# Patient Record
Sex: Female | Born: 1990 | Race: Black or African American | Hispanic: No | Marital: Single | Smoking: Current every day smoker
Health system: Southern US, Community
[De-identification: ages and names within clinical notes are randomized; demographics above are authoritative.]

## PROBLEM LIST (undated history)

## (undated) DIAGNOSIS — O039 Complete or unspecified spontaneous abortion without complication: Secondary | ICD-10-CM

---

## 2015-03-28 ENCOUNTER — Encounter (HOSPITAL_BASED_OUTPATIENT_CLINIC_OR_DEPARTMENT_OTHER): Payer: Self-pay | Admitting: *Deleted

## 2015-03-28 ENCOUNTER — Emergency Department (HOSPITAL_BASED_OUTPATIENT_CLINIC_OR_DEPARTMENT_OTHER)
Admission: EM | Admit: 2015-03-28 | Discharge: 2015-03-28 | Disposition: A | Discharge status: Home / Self Care | Payer: Medicaid Other | Attending: Emergency Medicine | Admitting: Emergency Medicine

## 2015-03-28 ENCOUNTER — Emergency Department (HOSPITAL_BASED_OUTPATIENT_CLINIC_OR_DEPARTMENT_OTHER): Payer: Medicaid Other

## 2015-03-28 DIAGNOSIS — O210 Mild hyperemesis gravidarum: Secondary | ICD-10-CM | POA: Insufficient documentation

## 2015-03-28 DIAGNOSIS — R1013 Epigastric pain: Secondary | ICD-10-CM | POA: Insufficient documentation

## 2015-03-28 DIAGNOSIS — R42 Dizziness and giddiness: Secondary | ICD-10-CM | POA: Insufficient documentation

## 2015-03-28 DIAGNOSIS — Z3A01 Less than 8 weeks gestation of pregnancy: Secondary | ICD-10-CM | POA: Insufficient documentation

## 2015-03-28 DIAGNOSIS — R110 Nausea: Secondary | ICD-10-CM

## 2015-03-28 DIAGNOSIS — O9989 Other specified diseases and conditions complicating pregnancy, childbirth and the puerperium: Secondary | ICD-10-CM | POA: Insufficient documentation

## 2015-03-28 DIAGNOSIS — O26899 Other specified pregnancy related conditions, unspecified trimester: Secondary | ICD-10-CM

## 2015-03-28 DIAGNOSIS — Z3491 Encounter for supervision of normal pregnancy, unspecified, first trimester: Secondary | ICD-10-CM

## 2015-03-28 DIAGNOSIS — O99331 Smoking (tobacco) complicating pregnancy, first trimester: Secondary | ICD-10-CM | POA: Insufficient documentation

## 2015-03-28 DIAGNOSIS — F17210 Nicotine dependence, cigarettes, uncomplicated: Secondary | ICD-10-CM | POA: Insufficient documentation

## 2015-03-28 DIAGNOSIS — R109 Unspecified abdominal pain: Secondary | ICD-10-CM

## 2015-03-28 LAB — COMPREHENSIVE METABOLIC PANEL
ALK PHOS: 35 U/L — AB (ref 38–126)
ALT: 10 U/L — AB (ref 14–54)
AST: 14 U/L — AB (ref 15–41)
Albumin: 3.6 g/dL (ref 3.5–5.0)
Anion gap: 5 (ref 5–15)
BUN: 15 mg/dL (ref 6–20)
CALCIUM: 10.3 mg/dL (ref 8.9–10.3)
CHLORIDE: 108 mmol/L (ref 101–111)
CO2: 22 mmol/L (ref 22–32)
CREATININE: 0.63 mg/dL (ref 0.44–1.00)
GFR calc Af Amer: >60 mL/min (ref >60)
Glucose, Bld: 86 mg/dL (ref 65–99)
Potassium: 4.1 mmol/L (ref 3.5–5.1)
Sodium: 135 mmol/L (ref 135–145)
Total Bilirubin: 0.3 mg/dL (ref 0.3–1.2)
Total Protein: 6.7 g/dL (ref 6.5–8.1)

## 2015-03-28 LAB — CBC WITH DIFFERENTIAL/PLATELET
BASOS PCT: 0 %
Basophils Absolute: 0 10*3/uL (ref 0.0–0.1)
EOS PCT: 4 %
Eosinophils Absolute: 0.3 10*3/uL (ref 0.0–0.7)
HCT: 36.7 % (ref 36.0–46.0)
Hemoglobin: 12.2 g/dL (ref 12.0–15.0)
LYMPHS ABS: 2.2 10*3/uL (ref 0.7–4.0)
Lymphocytes Relative: 32 %
MCH: 30 pg (ref 26.0–34.0)
MCHC: 33.2 g/dL (ref 30.0–36.0)
MCV: 90.4 fL (ref 78.0–100.0)
MONOS PCT: 9 %
Monocytes Absolute: 0.6 10*3/uL (ref 0.1–1.0)
Neutro Abs: 3.8 10*3/uL (ref 1.7–7.7)
Neutrophils Relative %: 55 %
PLATELETS: 207 10*3/uL (ref 150–400)
RBC: 4.06 MIL/uL (ref 3.87–5.11)
RDW: 13.4 % (ref 11.5–15.5)
WBC: 6.9 10*3/uL (ref 4.0–10.5)

## 2015-03-28 LAB — URINALYSIS, ROUTINE W REFLEX MICROSCOPIC
Bilirubin Urine: NEGATIVE
GLUCOSE, UA: NEGATIVE mg/dL
HGB URINE DIPSTICK: NEGATIVE
Ketones, ur: NEGATIVE mg/dL
Leukocytes, UA: NEGATIVE
Nitrite: NEGATIVE
PH: 7 (ref 5.0–8.0)
Protein, ur: NEGATIVE mg/dL
SPECIFIC GRAVITY, URINE: 1.022 (ref 1.005–1.030)

## 2015-03-28 LAB — LIPASE, BLOOD: Lipase: 28 U/L (ref 11–51)

## 2015-03-28 LAB — ABO/RH: ABO/RH(D): A POS

## 2015-03-28 LAB — PREGNANCY, URINE: PREG TEST UR: POSITIVE — AB

## 2015-03-28 LAB — HCG, QUANTITATIVE, PREGNANCY: HCG, BETA CHAIN, QUANT, S: 98263 m[IU]/mL — AB (ref <5)

## 2015-03-28 NOTE — Discharge Instructions (Signed)
Follow-up with an obstetrician in the next 1-2 weeks.  [facility] [place] is been provided in this discharge summary for you to call and arrange this appointment.   First Trimester of Pregnancy The first trimester of pregnancy is from week 1 until the end of week 12 (months 1 through 3). A week after a sperm fertilizes an egg, the egg will implant on the wall of the uterus. This embryo will begin to develop into a baby. Genes from you and your partner are forming the baby. The female genes determine whether the baby is a boy or a girl. At 6-8 weeks, the eyes and face are formed, and the heartbeat can be seen on ultrasound. At the end of 12 weeks, all the baby's organs are formed.  Now that you are pregnant, you will want to do everything you can to have a healthy baby. Two of the most important things are to get good prenatal care and to follow your health care provider's instructions. Prenatal care is all the medical care you receive before the baby's birth. This care will help prevent, find, and treat any problems during the pregnancy and childbirth. BODY CHANGES Your body goes through many changes during pregnancy. The changes vary from woman to woman.   You may gain or lose a couple of pounds at first.  You may feel sick to your stomach (nauseous) and throw up (vomit). If the vomiting is uncontrollable, call your health care provider.  You may tire easily.  You may develop headaches that can be relieved by medicines approved by your health care provider.  You may urinate more often. Painful urination may mean you have a bladder infection.  You may develop heartburn as a result of your pregnancy.  You may develop constipation because certain hormones are causing the muscles that push waste through your intestines to slow down.  You may develop hemorrhoids or swollen, bulging veins (varicose veins).  Your breasts may begin to grow larger and become  tender. Your nipples may stick out more, and the tissue that surrounds them (areola) may become darker.  Your gums may bleed and may be sensitive to brushing and flossing.  Dark spots or blotches (chloasma, mask of pregnancy) may develop on your face. This will likely fade after the baby is born.  Your menstrual periods will stop.  You may have a loss of appetite.  You may develop cravings for certain kinds of food.  You may have changes in your emotions from day to day, such as being excited to be pregnant or being concerned that something may go wrong with the pregnancy and baby.  You may have more vivid and strange dreams.  You may have changes in your hair. These can include thickening of your hair, rapid growth, and changes in texture. Some women also have hair loss during or after pregnancy, or hair that feels dry or thin. Your hair will most likely return to normal after your baby is born. WHAT TO EXPECT AT YOUR PRENATAL VISITS During a routine prenatal visit:  You will be weighed to make sure you and the baby are growing normally.  Your blood pressure will be taken.  Your abdomen will be measured to track your baby's growth.  The fetal heartbeat will be listened to starting around week 10 or 12 of your pregnancy.  Test results from any previous visits will be discussed. Your health care provider may ask you:  How you are  feeling.  If you are feeling the baby move.  If you have had any abnormal symptoms, such as leaking fluid, bleeding, severe headaches, or abdominal cramping.  If you are using any tobacco products, including cigarettes, chewing tobacco, and electronic cigarettes.  If you have any questions. Other tests that may be performed during your first trimester include:  Blood tests to find your blood type and to check for the presence of any previous infections. They will also be used to check for low iron levels (anemia) and Rh antibodies. Later in the  pregnancy, blood tests for diabetes will be done along with other tests if problems develop.  Urine tests to check for infections, diabetes, or protein in the urine.  An ultrasound to confirm the proper growth and development of the baby.  An amniocentesis to check for possible genetic problems.  Fetal screens for spina bifida and Down syndrome.  You may need other tests to make sure you and the baby are doing well.  HIV (human immunodeficiency virus) testing. Routine prenatal testing includes screening for HIV, unless you choose not to have this test. HOME CARE INSTRUCTIONS  Medicines  Follow your health care provider's instructions regarding medicine use. Specific medicines may be either safe or unsafe to take during pregnancy.  Take your prenatal vitamins as directed.  If you develop constipation, try taking a stool softener if your health care provider approves. Diet  Eat regular, well-balanced meals. Choose a variety of foods, such as meat or vegetable-based protein, fish, milk and low-fat dairy products, vegetables, fruits, and whole grain breads and cereals. Your health care provider will help you determine the amount of weight gain that is right for you.  Avoid raw meat and uncooked cheese. These carry germs that can cause birth defects in the baby.  Eating four or five small meals rather than three large meals a day may help relieve nausea and vomiting. If you start to feel nauseous, eating a few soda crackers can be helpful. Drinking liquids between meals instead of during meals also seems to help nausea and vomiting.  If you develop constipation, eat more high-fiber foods, such as fresh vegetables or fruit and whole grains. Drink enough fluids to keep your urine clear or pale yellow. Activity and Exercise  Exercise only as directed by your health care provider. Exercising will help you:  Control your weight.  Stay in shape.  Be prepared for labor and  delivery.  Experiencing pain or cramping in the lower abdomen or low back is a good sign that you should stop exercising. Check with your health care provider before continuing normal exercises.  Try to avoid standing for long periods of time. Move your legs often if you must stand in one place for a long time.  Avoid heavy lifting.  Wear low-heeled shoes, and practice good posture.  You may continue to have sex unless your health care provider directs you otherwise. Relief of Pain or Discomfort  Wear a good support bra for breast tenderness.   Take warm sitz baths to soothe any pain or discomfort caused by hemorrhoids. Use hemorrhoid cream if your health care provider approves.   Rest with your legs elevated if you have leg cramps or low back pain.  If you develop varicose veins in your legs, wear support hose. Elevate your feet for 15 minutes, 3-4 times a day. Limit salt in your diet. Prenatal Care  Schedule your prenatal visits by the twelfth week of pregnancy. They are usually  scheduled monthly at first, then more often in the last 2 months before delivery.  Write down your questions. Take them to your prenatal visits.  Keep all your prenatal visits as directed by your health care provider. Safety  Wear your seat belt at all times when driving.  Make a list of emergency phone numbers, including numbers for family, friends, the hospital, and police and fire departments. General Tips  Ask your health care provider for a referral to a local prenatal education class. Begin classes no later than at the beginning of month 6 of your pregnancy.  Ask for help if you have counseling or nutritional needs during pregnancy. Your health care provider can offer advice or refer you to specialists for help with various needs.  Do not use hot tubs, steam rooms, or saunas.  Do not douche or use tampons or scented sanitary pads.  Do not cross your legs for long periods of time.  Avoid  cat litter boxes and soil used by cats. These carry germs that can cause birth defects in the baby and possibly loss of the fetus by miscarriage or stillbirth.  Avoid all smoking, herbs, alcohol, and medicines not prescribed by your health care provider. Chemicals in these affect the formation and growth of the baby.  Do not use any tobacco products, including cigarettes, chewing tobacco, and electronic cigarettes. If you need help quitting, ask your health care provider. You may receive counseling support and other resources to help you quit.  Schedule a dentist appointment. At home, brush your teeth with a soft toothbrush and be gentle when you floss. SEEK MEDICAL CARE IF:   You have dizziness.  You have mild pelvic cramps, pelvic pressure, or nagging pain in the abdominal area.  You have persistent nausea, vomiting, or diarrhea.  You have a bad smelling vaginal discharge.  You have pain with urination.  You notice increased swelling in your face, hands, legs, or ankles. SEEK IMMEDIATE MEDICAL CARE IF:   You have a fever.  You are leaking fluid from your vagina.  You have spotting or bleeding from your vagina.  You have severe abdominal cramping or pain.  You have rapid weight gain or loss.  You vomit blood or material that looks like coffee grounds.  You are exposed to [place] measles and have never had them.  You are exposed to fifth disease or chickenpox.  You develop a severe headache.  You have shortness of breath.  You have any kind of trauma, such as from a fall or a car accident.   This information is not intended to replace advice given to you by your health care provider. Make sure you discuss any questions you have with your health care provider.   Document Released: 04/08/2001 Document Revised: 05/05/2014 Document Reviewed: 02/22/2013 Elsevier Interactive Patient Education [employer]

## 2015-03-28 NOTE — ED Provider Notes (Signed)
CSN: [id number]     Arrival date & time 03/28/15  1756 History  By signing my name below, I, Soijett Blue, attest that this documentation has been prepared under the direction and in the presence of Geoffery Lyons, MD. Electronically Signed: Soijett Blue, ED Scribe. 03/28/2015. 6:48 PM.   Chief Complaint  Patient presents with  . Nausea  . Dizziness      Patient is a 24 y.o. female presenting with abdominal pain. The history is provided by the patient. No language interpreter was used.  Abdominal Pain Pain location:  Epigastric Pain radiates to:  Does not radiate Pain severity:  Mild Onset quality:  Gradual Duration:  2 weeks Timing:  Intermittent Progression:  Unchanged Chronicity:  New Context: not sick contacts   Relieved by:  None tried Worsened by:  Nothing tried Ineffective treatments:  None tried Associated symptoms: nausea   Associated symptoms: no constipation, no diarrhea, no dysuria and no hematuria     HPI Comments: Carol Tate is a 24 y.o. female who presents to the Emergency Department complaining of intermittent nausea onset 2-3 weeks. She notes that yesterday when she ate she vomited following and that is not normal for her. She states that her LMP was in October and that her periods have always been irregular unless she was on birth control. She reports that he stopped taking her birth control earlier this year due to side effects of the medication and she never started taking birth control again. She states that she is having associated symptoms of dizziness, upper abdominal pain, and mid back pain. She denies constipation, diarrhea, dysuria, hematuria, and any other symptoms. Denies PMHx of acid reflux at this time.    History reviewed. No pertinent past medical history. History reviewed. No pertinent past surgical history. No family history on file. Social History  Substance Use Topics  . Smoking status: Current Every Day Smoker    Types: Cigarettes  .  Smokeless tobacco: Never Used  . Alcohol Use: Yes     Comment: 4 beers/week   OB History    No data available     Review of Systems  Gastrointestinal: Positive for nausea and abdominal pain. Negative for diarrhea and constipation.  Genitourinary: Negative for dysuria and hematuria.  All other systems reviewed and are negative.    Allergies  Review of patient's allergies indicates no known allergies.  Home Medications   Prior to Admission medications   Not on File   BP 125/80 mmHg  Pulse 64  Temp(Src) 98.4 F (36.9 C) (Oral)  Resp 16  Ht  (1.626 m)  Wt 192 lb (87.091 kg)  BMI 32.94 kg/m2  SpO2 100%  LMP 03/28/2015 Physical Exam  Constitutional: She is oriented to person, place, and time. She appears well-developed and well-nourished. No distress.  HENT:  Head: Normocephalic and atraumatic.  Mouth/Throat: Oropharynx is clear and moist.  Eyes: EOM are normal.  Neck: Neck supple.  Cardiovascular: Normal rate, regular rhythm and normal heart sounds.  Exam reveals no gallop and no friction rub.   No murmur heard. Pulmonary/Chest: Effort normal and breath sounds normal. No respiratory distress. She has no wheezes. She has no rales.  Abdominal: Soft. There is tenderness in the epigastric area. There is no rebound, no guarding and no CVA tenderness.  TTP in the epigastric region.   Musculoskeletal: Normal range of motion.  Neurological: She is alert and oriented to person, place, and time.  Skin: Skin is warm and dry. She is  not diaphoretic.  Psychiatric: She has a normal mood and affect. Her behavior is normal.  Nursing note and vitals reviewed.   ED Course  Procedures (including critical care time) DIAGNOSTIC STUDIES: Oxygen Saturation is 100% on RA, nl by my interpretation.    COORDINATION OF CARE: 6:47 PM Discussed treatment plan with pt at bedside which includes UA, labs, and US OB and pt agreed to plan.    Labs Review Labs Reviewed  URINALYSIS,  ROUTINE W REFLEX MICROSCOPIC (NOT AT [facility]) - Abnormal; Notable for the following:    APPearance CLOUDY (*)    All other components within normal limits  PREGNANCY, URINE - Abnormal; Notable for the following:    Preg Test, Ur POSITIVE (*)    All other components within normal limits  COMPREHENSIVE METABOLIC PANEL - Abnormal; Notable for the following:    AST 14 (*)    ALT 10 (*)    Alkaline Phosphatase 35 (*)    All other components within normal limits  HCG, QUANTITATIVE, PREGNANCY - Abnormal; Notable for the following:    hCG, Beta Chain, Quant, [place] [id number] (*)    All other components within normal limits  CBC WITH DIFFERENTIAL/PLATELET  LIPASE, BLOOD  ABO/RH    Imaging Review [place] Ob Comp Less 14 Wks  03/28/2015  CLINICAL DATA:  Increased abdominal pain and nausea EXAM: OBSTETRIC <14 WK US AND TRANSVAGINAL OB US TECHNIQUE: Both transabdominal and transvaginal ultrasound examinations were performed for complete evaluation of the gestation as well as the maternal uterus, adnexal regions, and pelvic cul-de-sac. Transvaginal technique was performed to assess early pregnancy. COMPARISON:  None. FINDINGS: Intrauterine gestational sac: Visualized/normal in shape. Yolk sac:  Visualized Embryo:  Visualized Cardiac Activity: Detected Heart Rate: 133  bpm MSD: 2.4 cm  mm   7 w   3  d US EDC: 11/11/2015 Maternal uterus/adnexae: Single intrauterine gestational sac. Cardiac activity detected. There appears to be to small adjacent fetal poles demonstrating cardiac activity however these are too small to confirm twin gestation. No subchorionic hemorrhage or acute finding. Ovaries appear normal. No free fluid. IMPRESSION: Viable intrauterine pregnancy. Query twin gestation, too small to confirm at this point. No acute or complicating feature by ultrasound. Electronically Signed   By: Judie PetitM.  Shick M.D.   On: 03/28/2015 20:47   [place] Ob Transvaginal  03/28/2015  CLINICAL DATA:  Increased abdominal pain and nausea  EXAM: OBSTETRIC <14 WK US AND TRANSVAGINAL OB US TECHNIQUE: Both transabdominal and transvaginal ultrasound examinations were performed for complete evaluation of the gestation as well as the maternal uterus, adnexal regions, and pelvic cul-de-sac. Transvaginal technique was performed to assess early pregnancy. COMPARISON:  None. FINDINGS: Intrauterine gestational sac: Visualized/normal in shape. Yolk sac:  Visualized Embryo:  Visualized Cardiac Activity: Detected Heart Rate: 133  bpm MSD: 2.4 cm  mm   7 w   3  d US EDC: 11/11/2015 Maternal uterus/adnexae: Single intrauterine gestational sac. Cardiac activity detected. There appears to be to small adjacent fetal poles demonstrating cardiac activity however these are too small to confirm twin gestation. No subchorionic hemorrhage or acute finding. Ovaries appear normal. No free fluid. IMPRESSION: Viable intrauterine pregnancy. Query twin gestation, too small to confirm at this point. No acute or complicating feature by ultrasound. Electronically Signed   By: Judie PetitM.  Shick M.D.   On: 03/28/2015 20:47   I have personally reviewed and evaluated these images and lab results as part of my medical decision-making.   EKG Interpretation None  MDM   Final diagnoses:  None    Patient presents with nausea and dizziness over the past several weeks. Her complaints are nonspecific and physical examination is unremarkable. Her workup does reveal a positive urine pregnancy test which came as quite a surprise to her. She underwent an ultrasound which revealed and intrauterine pregnancy and the possibility of a twin gestation. There are no other acute or complicating features by ultrasound and her laboratory studies are all reassuring. She will be discharged home with OB follow-up.  I personally performed the services described in this documentation, which was scribed in my presence. The recorded information has been reviewed and is accurate.       Geoffery Lyons,  MD 03/28/15 [phone]

## 2015-03-28 NOTE — ED Notes (Signed)
2-3 weeks of intermittent nausea and feeling lightheaded "like I'm going to pass out"

## 2017-04-04 IMAGING — US US OB COMP LESS 14 WK
1 series · 14 of 28 positions shown · non-contrast
Comparison: None.

CLINICAL DATA: Increased abdominal pain and nausea

EXAM:
OBSTETRIC <14 WK US AND TRANSVAGINAL OB US
TECHNIQUE: Both transabdominal and transvaginal ultrasound examinations were
performed for complete evaluation of the gestation as well as the
maternal uterus, adnexal regions, and pelvic cul-de-sac.
Transvaginal technique was performed to assess early pregnancy.

[Series 1: us ob comp less 14 wk · 0.10mm/px · 29 acquisitions, 14 frames shown]
[im 2/29]
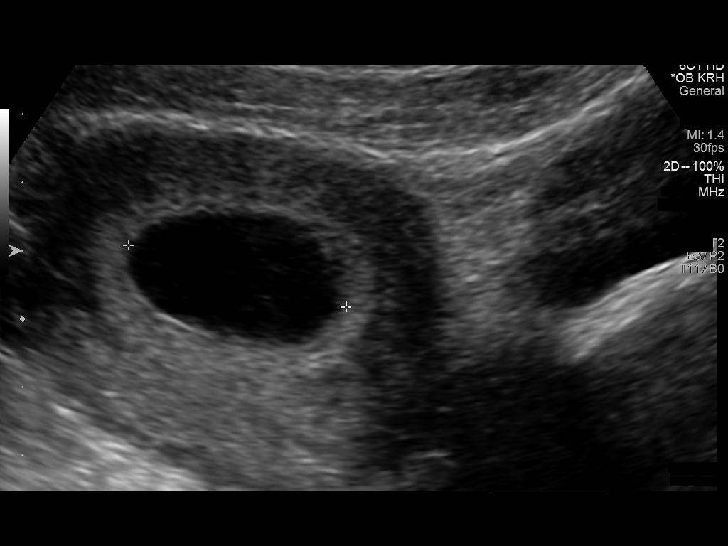
[im 4/29]
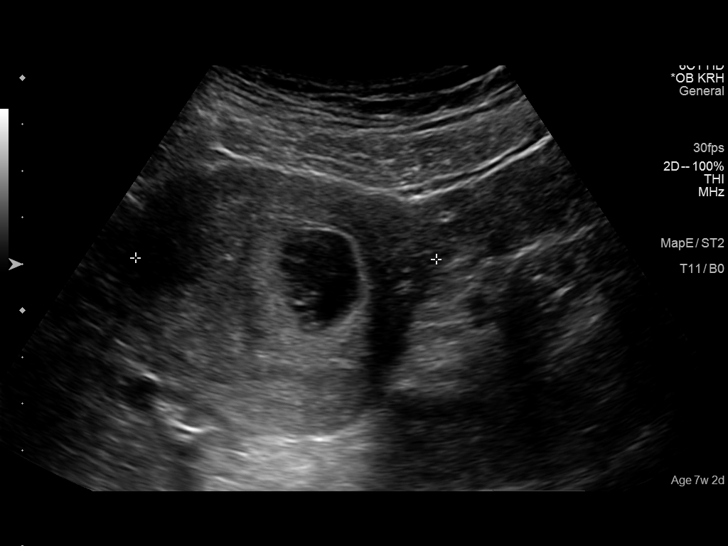
[im 6/29]
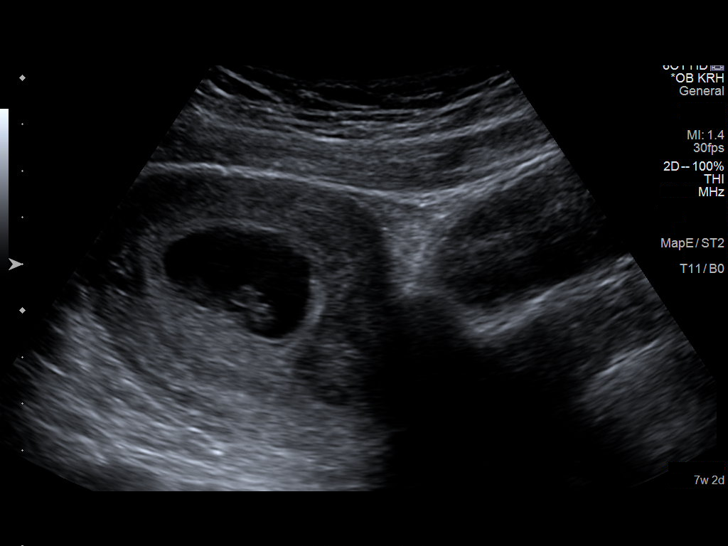
[im 8/29]
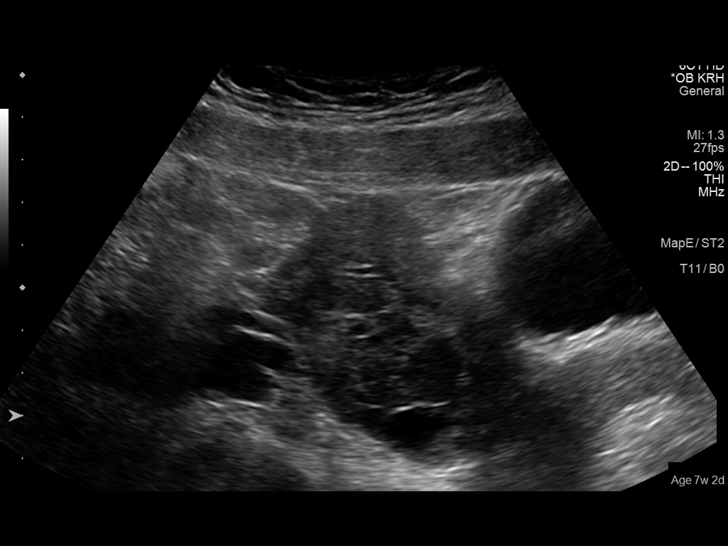
[im 10/29]
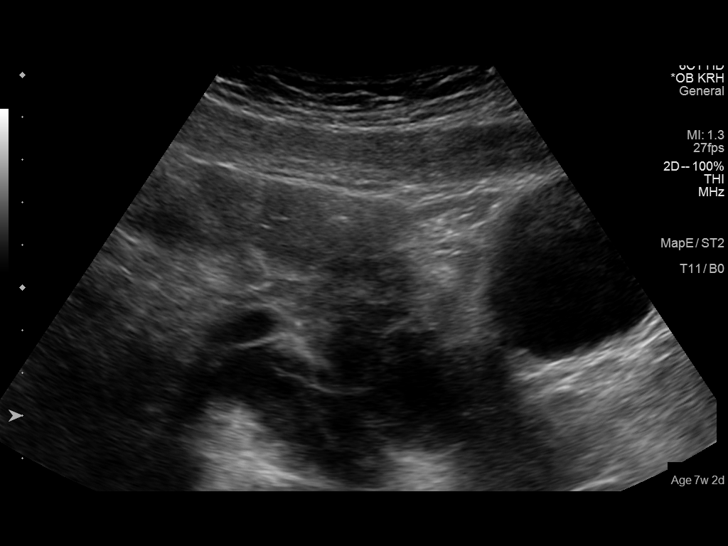
[im 12/29]
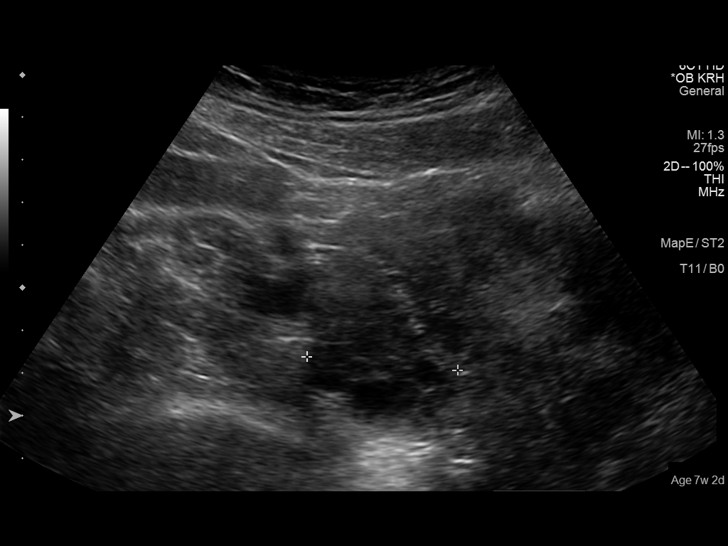
[im 14/29]
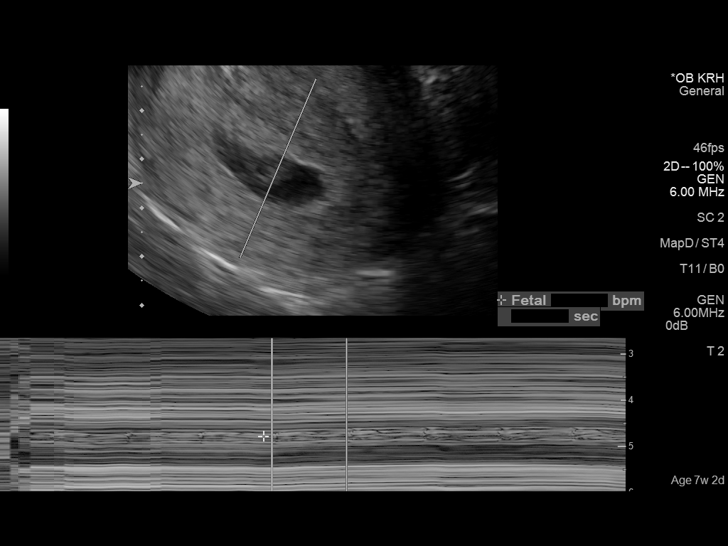
[im 16/29]
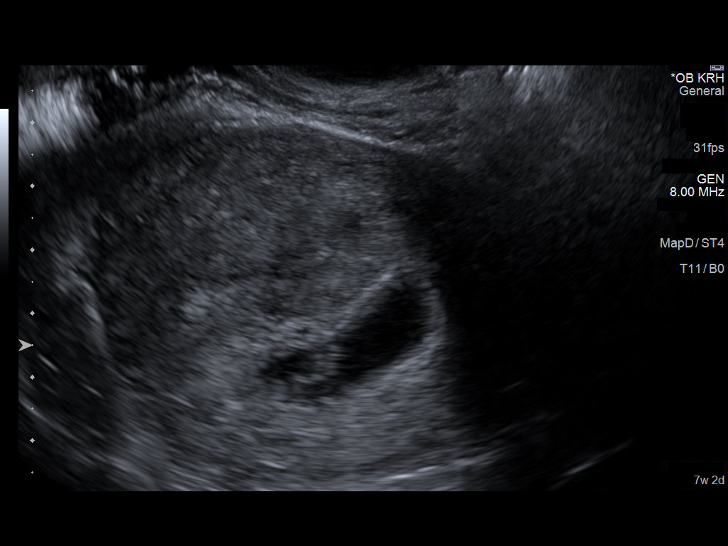
[im 18/29]
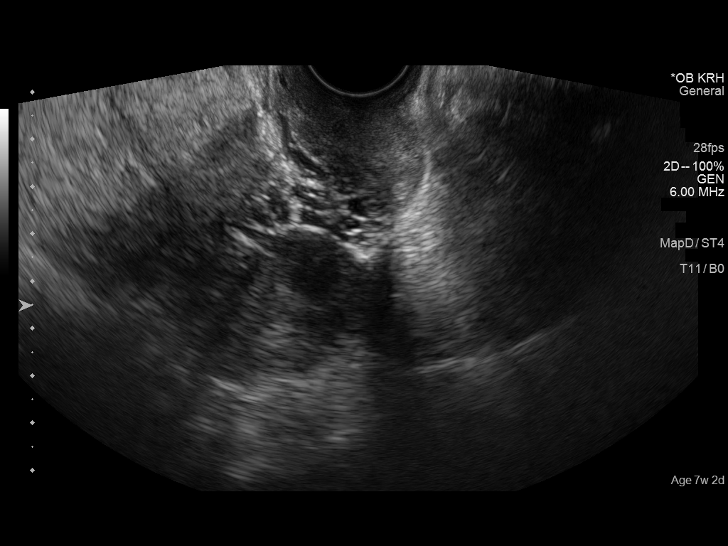
[im 20/29]
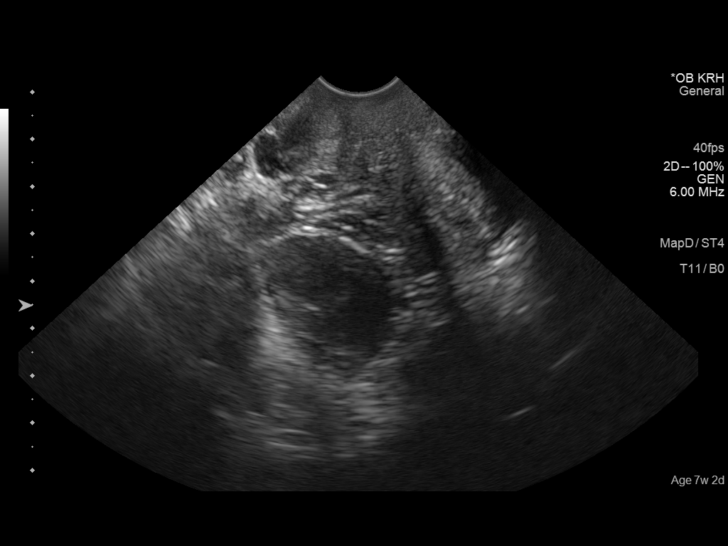
[im 22/29]
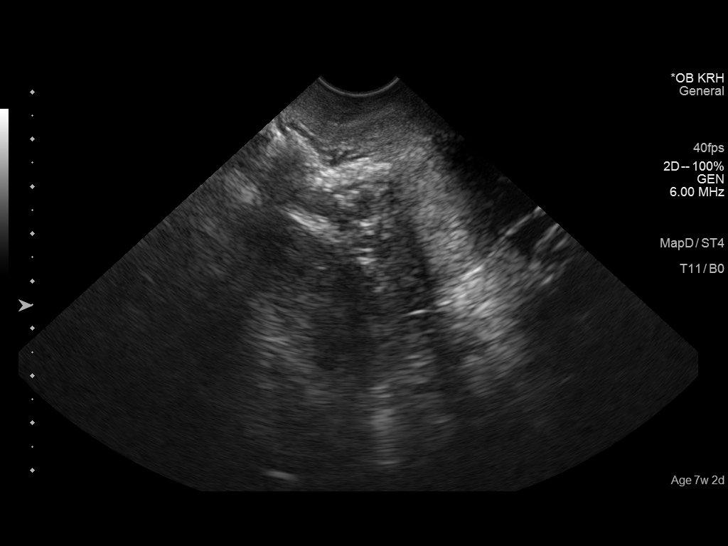
[im 24/29]
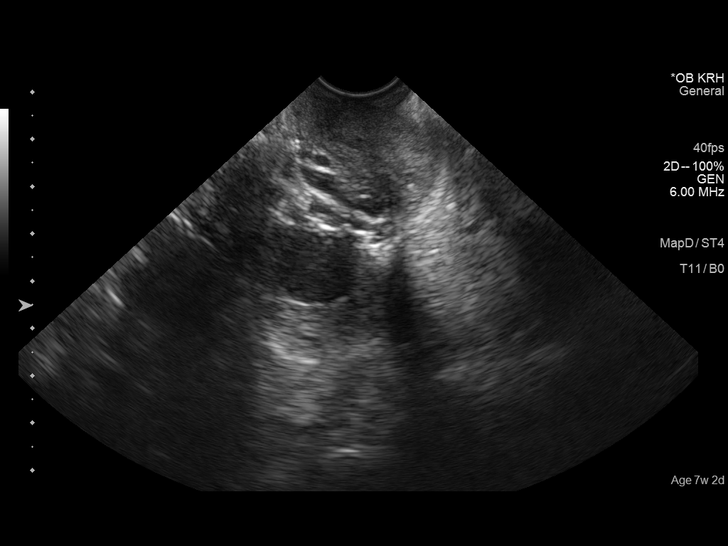
[im 26/29]
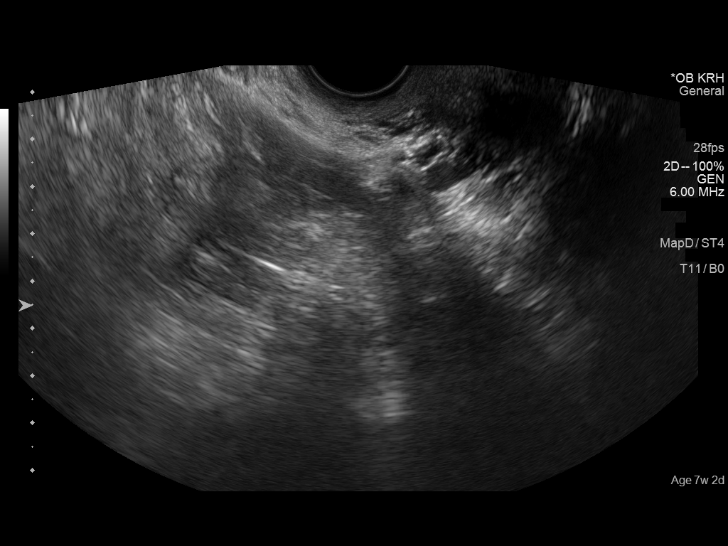
[im 29/29]
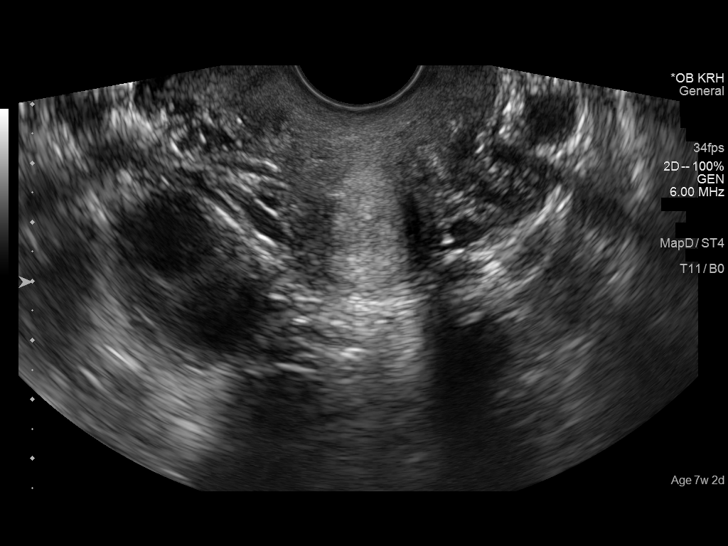

[14 of 28 positions shown; findings below may reference images not displayed]

FINDINGS: Intrauterine gestational sac: Visualized/normal in shape.

Yolk sac:  Visualized

Embryo:  Visualized

Cardiac Activity: Detected

Heart Rate: 133  bpm

MSD: 2.4 cm  mm   7 w   3  d

US EDC: 11/11/2015

Maternal uterus/adnexae: Single intrauterine gestational sac.
Cardiac activity detected. There appears to be to small adjacent
fetal poles demonstrating cardiac activity however these are too
small to confirm twin gestation. No subchorionic hemorrhage or acute
finding. Ovaries appear normal. No free fluid.
IMPRESSION: Viable intrauterine pregnancy. Query twin gestation, too small to
confirm at this point. No acute or complicating feature by
ultrasound.

## 2019-02-17 ENCOUNTER — Other Ambulatory Visit: Payer: Self-pay

## 2019-02-17 ENCOUNTER — Emergency Department (HOSPITAL_BASED_OUTPATIENT_CLINIC_OR_DEPARTMENT_OTHER)
Admission: EM | Admit: 2019-02-17 | Discharge: 2019-02-17 | Disposition: A | Discharge status: Home / Self Care | Payer: Self-pay | Attending: Emergency Medicine | Admitting: Emergency Medicine

## 2019-02-17 ENCOUNTER — Encounter (HOSPITAL_BASED_OUTPATIENT_CLINIC_OR_DEPARTMENT_OTHER): Payer: Self-pay

## 2019-02-17 DIAGNOSIS — Y9389 Activity, other specified: Secondary | ICD-10-CM | POA: Insufficient documentation

## 2019-02-17 DIAGNOSIS — S169XXA Unspecified injury of muscle, fascia and tendon at neck level, initial encounter: Secondary | ICD-10-CM | POA: Insufficient documentation

## 2019-02-17 DIAGNOSIS — Z880 Allergy status to penicillin: Secondary | ICD-10-CM | POA: Insufficient documentation

## 2019-02-17 DIAGNOSIS — S161XXA Strain of muscle, fascia and tendon at neck level, initial encounter: Secondary | ICD-10-CM

## 2019-02-17 DIAGNOSIS — Y999 Unspecified external cause status: Secondary | ICD-10-CM | POA: Insufficient documentation

## 2019-02-17 DIAGNOSIS — Y92410 Unspecified street and highway as the place of occurrence of the external cause: Secondary | ICD-10-CM | POA: Insufficient documentation

## 2019-02-17 DIAGNOSIS — F17210 Nicotine dependence, cigarettes, uncomplicated: Secondary | ICD-10-CM | POA: Insufficient documentation

## 2019-02-17 MED ORDER — CYCLOBENZAPRINE HCL 10 MG PO TABS: 5.0000 mg | ORAL_TABLET | Freq: Two times a day (BID) | ORAL | 0 refills | Status: DC | PRN

## 2019-02-17 MED ORDER — MELOXICAM 15 MG PO TABS: 15.0000 mg | ORAL_TABLET | Freq: Every day | ORAL | 0 refills | Status: DC

## 2019-02-17 MED FILL — CYCLOBENZAPRINE HCL 10 MG T: 10 | 10 days supply | Qty: 20 | Fill #0

## 2019-02-17 MED FILL — MELOXICAM 15 MG TABLET: 15 | 10 days supply | Qty: 10 | Fill #0
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
## 2019-02-17 NOTE — Discharge Instructions (Addendum)

## 2019-03-07 ENCOUNTER — Ambulatory Visit (HOSPITAL_BASED_OUTPATIENT_CLINIC_OR_DEPARTMENT_OTHER): Admission: RE | Admit: 2019-03-07 | Payer: Self-pay | Source: Ambulatory Visit

## 2019-03-07 ENCOUNTER — Encounter (HOSPITAL_BASED_OUTPATIENT_CLINIC_OR_DEPARTMENT_OTHER): Payer: Self-pay

## 2019-03-07 ENCOUNTER — Other Ambulatory Visit: Payer: Self-pay

## 2019-03-07 ENCOUNTER — Emergency Department (HOSPITAL_BASED_OUTPATIENT_CLINIC_OR_DEPARTMENT_OTHER)
Admission: EM | Admit: 2019-03-07 | Discharge: 2019-03-07 | Discharge status: Against Medical Advice | Payer: Self-pay | Attending: Emergency Medicine | Admitting: Emergency Medicine

## 2019-03-07 DIAGNOSIS — F17210 Nicotine dependence, cigarettes, uncomplicated: Secondary | ICD-10-CM | POA: Insufficient documentation

## 2019-03-07 DIAGNOSIS — Z79899 Other long term (current) drug therapy: Secondary | ICD-10-CM | POA: Insufficient documentation

## 2019-03-07 DIAGNOSIS — Z5329 Procedure and treatment not carried out because of patient's decision for other reasons: Secondary | ICD-10-CM | POA: Insufficient documentation

## 2019-03-07 DIAGNOSIS — O200 Threatened abortion: Secondary | ICD-10-CM | POA: Insufficient documentation

## 2019-03-07 LAB — BASIC METABOLIC PANEL
Anion gap: 7 (ref 5–15)
BUN: 11 mg/dL (ref 6–20)
CO2: 24 mmol/L (ref 22–32)
Calcium: 10.2 mg/dL (ref 8.9–10.3)
Chloride: 108 mmol/L (ref 98–111)
Creatinine, Ser: 0.56 mg/dL (ref 0.44–1.00)
GFR calc Af Amer: >60 mL/min (ref >60)
GFR calc non Af Amer: >60 mL/min (ref >60)
Glucose, Bld: 89 mg/dL (ref 70–99)
Potassium: 3.9 mmol/L (ref 3.5–5.1)
Sodium: 139 mmol/L (ref 135–145)

## 2019-03-07 LAB — CBC WITH DIFFERENTIAL/PLATELET
Abs Immature Granulocytes: 0.01 10*3/uL (ref 0.00–0.07)
Basophils Absolute: 0 10*3/uL (ref 0.0–0.1)
Basophils Relative: 1 %
Eosinophils Absolute: 0 10*3/uL (ref 0.0–0.5)
Eosinophils Relative: 1 %
HCT: 40.2 % (ref 36.0–46.0)
Hemoglobin: 12.6 g/dL (ref 12.0–15.0)
Immature Granulocytes: 0 %
Lymphocytes Relative: 26 %
Lymphs Abs: 1.5 10*3/uL (ref 0.7–4.0)
MCH: 28.8 pg (ref 26.0–34.0)
MCHC: 31.3 g/dL (ref 30.0–36.0)
MCV: 91.8 fL (ref 80.0–100.0)
Monocytes Absolute: 0.2 10*3/uL (ref 0.1–1.0)
Monocytes Relative: 4 %
Neutro Abs: 3.9 10*3/uL (ref 1.7–7.7)
Neutrophils Relative %: 68 %
Platelets: 322 10*3/uL (ref 150–400)
RBC: 4.38 MIL/uL (ref 3.87–5.11)
RDW: 15.2 % (ref 11.5–15.5)
WBC: 5.7 10*3/uL (ref 4.0–10.5)
nRBC: 0 % (ref 0.0–0.2)

## 2019-03-07 LAB — HCG, QUANTITATIVE, PREGNANCY: hCG, Beta Chain, Quant, S: 88 m[IU]/mL — ABNORMAL HIGH (ref <5)

## 2019-03-07 LAB — WET PREP, GENITAL
Sperm: NONE SEEN
Trich, Wet Prep: NONE SEEN
Yeast Wet Prep HPF POC: NONE SEEN

## 2019-03-07 LAB — PREGNANCY, URINE: Preg Test, Ur: NEGATIVE

## 2019-03-07 NOTE — ED Notes (Signed)
Pt had requested to leave without having the [place] that was ordered.  Pt states that she would like to go home.  Paperwork given, pt left ambulatory in no acute distress after signing AMA paperwork

## 2019-03-07 NOTE — ED Triage Notes (Signed)
Pt c/o vaginal bleeding x 1-1.5 weeks-sates LMP was in sept-states she had pos home preg test 2 weeks ago-NAD-steady gait

## 2019-03-07 NOTE — Discharge Instructions (Signed)
Please make sure you follow-up with the health department or another OB/GYN in 48 hours.  Return to the emergency department if you have any new or worsening symptoms.

## 2019-03-07 NOTE — ED Provider Notes (Signed)
[facility] EMERGENCY DEPARTMENT Provider Note   CSN: [id number] Arrival date & time: 03/07/19  1302     History   Chief Complaint Chief Complaint  Patient presents with  . Vaginal Bleeding    HPI Carol Tate is a 28 y.o. female.     Patient is a 28 year old female G4P2 presenting to the emergency department for pelvic cramping and vaginal bleeding.  Patient reports that she began a normal menstrual cycle mid-September.  Reports that her cycle then lasted longer than usual and she was having spotting with brown blood.  Reports that about a week ago she began to have more heavier brown and black blood and then large amounts of bright red blood with clots yesterday and today.  Reports that she is having some mild pelvic cramping.  Reports that she took a home pregnancy test 3 weeks ago and it was positive.  Has not seen OB/GYN.  Has a history of miscarriage.  Denies any fever, chills, nausea, vomiting.     History reviewed. No pertinent past medical history.  There are no active problems to display for this patient.   Past Surgical History:  Procedure Laterality Date  . CESAREAN SECTION       OB History   No obstetric history on file.      Home Medications    Prior to Admission medications   Medication Sig Start Date End Date Taking? Authorizing Provider  cyclobenzaprine (FLEXERIL) 10 MG tablet Take 0.5-1 tablets (5-10 mg total) by mouth 2 (two) times daily as needed for muscle spasms. 02/17/19   Arthor Captain, PA-C  meloxicam (MOBIC) 15 MG tablet Take 1 tablet (15 mg total) by mouth daily. Take 1 daily with food. 02/17/19   Arthor Captain, PA-C    Family History No family history on file.  Social History Social History   Tobacco Use  . Smoking status: Current Every Day Smoker    Types: Cigarettes  . Smokeless tobacco: Never Used  Substance Use Topics  . Alcohol use: Yes    Comment: occ  . Drug use: No     Allergies   Penicillins    Review of Systems Review of Systems  Constitutional: Negative for chills and fever.  Respiratory: Negative for cough and shortness of breath.   Gastrointestinal: Negative for abdominal pain, diarrhea, nausea and vomiting.  Genitourinary: Positive for pelvic pain and vaginal bleeding. Negative for decreased urine volume, dysuria, flank pain, frequency, vaginal discharge and vaginal pain.  Musculoskeletal: Negative for back pain.  Skin: Negative for rash and wound.  Neurological: Negative for dizziness, light-headedness and headaches.     Physical Exam Updated Vital Signs BP 121/81   Pulse 82   Temp 98.8 F (37.1 C) (Oral)   Resp 16   Ht 5\' 4"  (1.626 m)   Wt 68 kg   SpO2 100%   BMI 25.75 kg/m   Physical Exam Vitals signs and nursing note reviewed. Exam conducted with a chaperone present.  Constitutional:      General: She is not in acute distress.    Appearance: Normal appearance. She is not ill-appearing, toxic-appearing or diaphoretic.  HENT:     Head: Normocephalic.     Mouth/Throat:     Mouth: Mucous membranes are moist.  Eyes:     Conjunctiva/sclera: Conjunctivae normal.  Pulmonary:     Effort: Pulmonary effort is normal.  Abdominal:     General: Abdomen is flat.     Tenderness: There is no abdominal tenderness.  There is no guarding.  Genitourinary:    Labia:        Right: No rash or lesion.        Left: No rash or lesion.      Vagina: Bleeding present.     Cervix: No cervical motion tenderness.     Adnexa: Right adnexa normal and left adnexa normal.     Comments: Difficult to examine due to amount of blood in vagina.  Skin:    General: Skin is dry.  Neurological:     Mental Status: She is alert.  Psychiatric:        Mood and Affect: Mood normal.      ED Treatments / Results  Labs (all labs ordered are listed, but only abnormal results are displayed) Labs Reviewed  WET PREP, GENITAL - Abnormal; Notable for the following components:      Result Value    Clue Cells Wet Prep HPF POC PRESENT (*)    WBC, Wet Prep HPF POC FEW (*)    All other components within normal limits  HCG, QUANTITATIVE, PREGNANCY - Abnormal; Notable for the following components:   hCG, Beta Chain, Quant, S 88 (*)    All other components within normal limits  PREGNANCY, URINE  BASIC METABOLIC PANEL  CBC WITH DIFFERENTIAL/PLATELET  GC/CHLAMYDIA PROBE AMP ([place]) NOT AT [facility]    EKG None  Radiology No results found.  Procedures Procedures (including critical care time)  Medications Ordered in ED Medications - No data to display   Initial Impression / Assessment and Plan / ED Course  I have reviewed the triage vital signs and the nursing notes.  Pertinent labs & imaging results that were available during my care of the patient were reviewed by me and considered in my medical decision making (see chart for details).  Clinical Course as of Mar 07 1619  Mon Mar 07, 2019  1511 Patient here with vaginal bleeding and +home pregnancy test. Per report from the lab- the patient's urine pregnancy test turned positive just after the 3 minute time marker of the test. It was suggested to get an hcg quant although the urine pregnancy test will have to be documented as negative in the chart   [KM]  1612 Patient has a normal metabolic panel and normal CBC.  She does have a vaginal bleeding on my exam.  Her hCG was only 4488 which tells me that this patient is likely having a miscarriage.  This was discussed with the patient.  Offered ultrasound to look for any retained products.  However, patient reported that she would like to just follow-up with the health department.  Advised her that she should follow-up in 48 hours and if not she should return to the emergency department if she has new or worsening symptoms.  We discussed the nature and purpose, risks and benefits, as well as, the alternatives of treatment. Time was given to allow the opportunity to ask questions and  consider their options, and after the discussion, the patient decided to refuse the ultrasound stating she needed to leave to get her children. The patient was informed that refusal could lead to, but was not limited to, death, permanent disability, or severe pain. If present, I asked the relatives or significant others to dissuade them without success. Prior to refusing, I determined that the patient had the capacity to make their decision and understood the consequences of that decision. After refusal, I made every reasonable opportunity to treat them  to the best of my ability.  The patient was notified that they may return to the emergency department at any time for further treatment.       [KM]    Clinical Course User Index [KM] Alveria Apley, PA-C         Clinical Impression: 1. Threatened miscarriage        Final Clinical Impressions(s) / ED Diagnoses   Final diagnoses:  Threatened miscarriage    ED Discharge Orders    None       Kristine Royal 03/07/19 1620    Dorie Rank, MD 03/07/19 [phone]

## 2019-03-09 LAB — GC/CHLAMYDIA PROBE AMP (~~LOC~~) NOT AT ARMC
Chlamydia: NEGATIVE
Neisseria Gonorrhea: NEGATIVE

## 2019-05-13 ENCOUNTER — Encounter (HOSPITAL_BASED_OUTPATIENT_CLINIC_OR_DEPARTMENT_OTHER): Payer: Self-pay | Admitting: *Deleted

## 2019-05-13 ENCOUNTER — Other Ambulatory Visit: Payer: Self-pay

## 2019-05-13 ENCOUNTER — Emergency Department (HOSPITAL_BASED_OUTPATIENT_CLINIC_OR_DEPARTMENT_OTHER)
Admission: EM | Admit: 2019-05-13 | Discharge: 2019-05-13 | Disposition: A | Discharge status: Home / Self Care | Payer: Self-pay | Attending: Emergency Medicine | Admitting: Emergency Medicine

## 2019-05-13 DIAGNOSIS — Z113 Encounter for screening for infections with a predominantly sexual mode of transmission: Secondary | ICD-10-CM | POA: Insufficient documentation

## 2019-05-13 DIAGNOSIS — F17210 Nicotine dependence, cigarettes, uncomplicated: Secondary | ICD-10-CM | POA: Insufficient documentation

## 2019-05-13 DIAGNOSIS — B9689 Other specified bacterial agents as the cause of diseases classified elsewhere: Secondary | ICD-10-CM

## 2019-05-13 DIAGNOSIS — Z880 Allergy status to penicillin: Secondary | ICD-10-CM | POA: Insufficient documentation

## 2019-05-13 DIAGNOSIS — N760 Acute vaginitis: Secondary | ICD-10-CM | POA: Insufficient documentation

## 2019-05-13 HISTORY — DX: Complete or unspecified spontaneous abortion without complication: O03.9

## 2019-05-13 LAB — URINALYSIS, ROUTINE W REFLEX MICROSCOPIC
Bilirubin Urine: NEGATIVE
Glucose, UA: NEGATIVE mg/dL
Hgb urine dipstick: NEGATIVE
Ketones, ur: NEGATIVE mg/dL
Leukocytes,Ua: NEGATIVE
Nitrite: NEGATIVE
Protein, ur: NEGATIVE mg/dL
Specific Gravity, Urine: >1.03 — ABNORMAL HIGH (ref 1.005–1.030)
pH: 6.5 (ref 5.0–8.0)

## 2019-05-13 LAB — WET PREP, GENITAL
Sperm: NONE SEEN
Trich, Wet Prep: NONE SEEN
Yeast Wet Prep HPF POC: NONE SEEN

## 2019-05-13 LAB — PREGNANCY, URINE: Preg Test, Ur: NEGATIVE

## 2019-05-13 LAB — HIV ANTIBODY (ROUTINE TESTING W REFLEX): HIV Screen 4th Generation wRfx: NONREACTIVE

## 2019-05-13 MED ORDER — METRONIDAZOLE 500 MG PO TABS: 500.0000 mg | ORAL_TABLET | Freq: Two times a day (BID) | ORAL | 0 refills | Status: AC

## 2019-05-13 MED ORDER — METRONIDAZOLE 500 MG PO TABS: 500.0000 mg | ORAL_TABLET | Freq: Two times a day (BID) | ORAL | 0 refills | Status: DC

## 2019-05-13 MED FILL — METRONIDAZOLE 500 MG TABS: 500 | 7 days supply | Qty: 14 | Fill #0

## 2019-05-13 NOTE — Discharge Instructions (Addendum)
You were given a prescription for antibiotics. Please take the antibiotic prescription fully. Do not drink alcohol while taking this antibiotic.  You have been tested for HIV, syphilis, chlamydia and gonorrhea.  These results will be available in approximately 3 days and you will be contacted by the hospital if the results are positive. Avoid sexual contact until you are aware of the results, and please inform all sexual partners if you test positive for any of these diseases.  Please follow up with your primary care provider within 5-7 days for re-evaluation of your symptoms. If you do not have a primary care provider, information for a healthcare clinic has been provided for you to make arrangements for follow up care. Please return to the emergency department for any new or worsening symptoms.

## 2019-05-13 NOTE — ED Provider Notes (Signed)
[facility] EMERGENCY DEPARTMENT Provider Note   CSN: [id number] Arrival date & time: 05/13/19  3976     History Chief Complaint  Patient presents with  . Vaginal discharge    Carol Tate is a 29 y.o. female.  HPI   A 29 year old female presenting for evaluation of vaginal discharge.  States she had a miscarriage 02/2020.  She had a normal menstrual cycle in December.  Following her menstrual cycle she has had white discharge for about the last 2 weeks and she also reports a fishy odor.  Her symptoms feel similar to when she had BV in the past.  She states she has been with the same partner for the last 7 years and has lower concern for STIs however is requesting testing.  She denies fevers, abdominal pain, nausea, vomiting.  She does report some urinary frequency.  Denies dysuria.  Denies continued vaginal bleeding.  Past Medical History:  Diagnosis Date  . Miscarriage     There are no problems to display for this patient.   Past Surgical History:  Procedure Laterality Date  . CESAREAN SECTION       OB History    Gravida  4   Para  2   Term      Preterm      AB  2   Living        SAB      TAB      Ectopic      Multiple      Live Births              No family history on file.  Social History   Tobacco Use  . Smoking status: Current Every Day Smoker    Packs/day: 0.50    Types: Cigarettes  . Smokeless tobacco: Never Used  Substance Use Topics  . Alcohol use: Yes    Alcohol/week: 2.0 standard drinks    Types: 2 Cans of beer per week    Comment: at least 3-4 days a week  . Drug use: No    Home Medications Prior to Admission medications   Medication Sig Start Date End Date Taking? Authorizing Provider  metroNIDAZOLE (FLAGYL) 500 MG tablet Take 1 tablet (500 mg total) by mouth 2 (two) times daily for 7 days. 05/13/19 05/20/19  Isbella Arline S, PA-C    Allergies    Penicillins  Review of Systems   Review of Systems    Constitutional: Negative for chills and fever.  HENT: Negative for ear pain and sore throat.   Eyes: Negative for visual disturbance.  Respiratory: Negative for cough and shortness of breath.   Cardiovascular: Negative for chest pain.  Gastrointestinal: Negative for abdominal pain, constipation, diarrhea, nausea and vomiting.  Genitourinary: Positive for frequency and vaginal discharge. Negative for dysuria, flank pain, hematuria, pelvic pain and vaginal bleeding.  Musculoskeletal: Negative for back pain.  Skin: Negative for rash.  Neurological: Negative for headaches.  All other systems reviewed and are negative.   Physical Exam Updated Vital Signs BP 133/72 (BP Location: Right Arm)   Pulse 67   Temp 99.2 F (37.3 C) (Oral)   Resp 14   Ht 5\' 4"  (1.626 m)   Wt 68.9 kg   LMP 04/22/2019   SpO2 100%   BMI 26.09 kg/m   Physical Exam Vitals and nursing note reviewed.  Constitutional:      General: She is not in acute distress.    Appearance: She is well-developed.  HENT:  Head: Normocephalic and atraumatic.  Eyes:     Conjunctiva/sclera: Conjunctivae normal.  Cardiovascular:     Rate and Rhythm: Normal rate and regular rhythm.     Heart sounds: No murmur.  Pulmonary:     Effort: Pulmonary effort is normal. No respiratory distress.     Breath sounds: Normal breath sounds.  Abdominal:     General: Bowel sounds are normal.     Palpations: Abdomen is soft.     Tenderness: There is no abdominal tenderness. There is no guarding or rebound.  Genitourinary:    Comments: Exam performed by Rodney Booze,  exam chaperoned Date: 05/13/2019 Pelvic exam: normal external genitalia without evidence of trauma. VULVA: normal appearing vulva with no masses, tenderness or lesion. VAGINA: normal appearing vagina with normal color and no lesions. Moderate white discharge present.  CERVIX: normal appearing cervix without lesions, cervical motion tenderness absent, cervical os closed  with purulent white discharge present, Wet prep and DNA probe for chlamydia and GC obtained.   ADNEXA: normal adnexa in size, nontender and no masses UTERUS: uterus is normal size, shape, consistency and nontender.  Musculoskeletal:     Cervical back: Neck supple.  Skin:    General: Skin is warm and dry.  Neurological:     Mental Status: She is alert.     ED Results / Procedures / Treatments   Labs (all labs ordered are listed, but only abnormal results are displayed) Labs Reviewed  WET PREP, GENITAL - Abnormal; Notable for the following components:      Result Value   Clue Cells Wet Prep HPF POC PRESENT (*)    WBC, Wet Prep HPF POC MANY (*)    All other components within normal limits  URINALYSIS, ROUTINE W REFLEX MICROSCOPIC - Abnormal; Notable for the following components:   APPearance CLOUDY (*)    Specific Gravity, Urine >1.030 (*)    All other components within normal limits  PREGNANCY, URINE  HIV ANTIBODY (ROUTINE TESTING W REFLEX)  RPR  GC/CHLAMYDIA PROBE AMP ([place]) NOT AT [facility]    EKG None  Radiology No results found.  Procedures Procedures (including critical care time)  Medications Ordered in ED Medications - No data to display  ED Course  I have reviewed the triage vital signs and the nursing notes.  Pertinent labs & imaging results that were available during my care of the patient were reviewed by me and considered in my medical decision making (see chart for details).    MDM Rules/Calculators/A&P                      29 year old female presenting with 2-week history of white vaginal discharge.  No pelvic pain, vomiting, diarrhea.  Has urinary frequency but no other associated urinary symptoms.  Abdominal exam is benign.  Urinalysis shows no evidence of UTI.  Urine pregnancy test neg.  Wet prep with clue cells and wbc's, consistent with BV and will tx with flagyl.  GC chlamydia, HIV, RPR pending. States low risk for STD, will hold on empiric tx.  Advised f/u with pcp and return precautions. She voices understanding and is in agreement.   Final Clinical Impression(s) / ED Diagnoses Final diagnoses:  Bacterial vaginosis  Encounter for screening for bacterial sexually transmitted disease    Rx / DC Orders ED Discharge Orders         Ordered    metroNIDAZOLE (FLAGYL) 500 MG tablet  2 times daily  05/13/19 1116           Meryle Pugmire S, PA-C 05/13/19 1118    Raeford Razor, MD 05/13/19 1246

## 2019-05-13 NOTE — ED Triage Notes (Signed)
Had a  miscarriage 2 months ago and had some vaginal discharges and fishy odor for 2 and half weeks ago.

## 2019-05-14 LAB — RPR: RPR Ser Ql: NONREACTIVE

## 2019-05-16 LAB — GC/CHLAMYDIA PROBE AMP (~~LOC~~) NOT AT ARMC
Chlamydia: NEGATIVE
Neisseria Gonorrhea: NEGATIVE

## 2022-01-27 ENCOUNTER — Other Ambulatory Visit: Payer: Self-pay

## 2022-01-27 ENCOUNTER — Emergency Department (HOSPITAL_BASED_OUTPATIENT_CLINIC_OR_DEPARTMENT_OTHER)
Admission: EM | Admit: 2022-01-27 | Discharge: 2022-01-27 | Disposition: A | Discharge status: Home / Self Care | Payer: Self-pay | Attending: Emergency Medicine | Admitting: Emergency Medicine

## 2022-01-27 ENCOUNTER — Emergency Department (HOSPITAL_BASED_OUTPATIENT_CLINIC_OR_DEPARTMENT_OTHER): Payer: Self-pay

## 2022-01-27 ENCOUNTER — Encounter (HOSPITAL_BASED_OUTPATIENT_CLINIC_OR_DEPARTMENT_OTHER): Payer: Self-pay

## 2022-01-27 DIAGNOSIS — R000 Tachycardia, unspecified: Secondary | ICD-10-CM | POA: Insufficient documentation

## 2022-01-27 DIAGNOSIS — Z3A00 Weeks of gestation of pregnancy not specified: Secondary | ICD-10-CM | POA: Insufficient documentation

## 2022-01-27 DIAGNOSIS — O200 Threatened abortion: Secondary | ICD-10-CM | POA: Insufficient documentation

## 2022-01-27 LAB — CBC WITH DIFFERENTIAL/PLATELET
Abs Immature Granulocytes: 0.01 10*3/uL (ref 0.00–0.07)
Basophils Absolute: 0 10*3/uL (ref 0.0–0.1)
Basophils Relative: 1 %
Eosinophils Absolute: 0.1 10*3/uL (ref 0.0–0.5)
Eosinophils Relative: 2 %
HCT: 35.3 % — ABNORMAL LOW (ref 36.0–46.0)
Hemoglobin: 11.3 g/dL — ABNORMAL LOW (ref 12.0–15.0)
Immature Granulocytes: 0 %
Lymphocytes Relative: 34 %
Lymphs Abs: 1.8 10*3/uL (ref 0.7–4.0)
MCH: 27.9 pg (ref 26.0–34.0)
MCHC: 32 g/dL (ref 30.0–36.0)
MCV: 87.2 fL (ref 80.0–100.0)
Monocytes Absolute: 0.3 10*3/uL (ref 0.1–1.0)
Monocytes Relative: 7 %
Neutro Abs: 3 10*3/uL (ref 1.7–7.7)
Neutrophils Relative %: 56 %
Platelets: 273 10*3/uL (ref 150–400)
RBC: 4.05 MIL/uL (ref 3.87–5.11)
RDW: 17.1 % — ABNORMAL HIGH (ref 11.5–15.5)
WBC: 5.2 10*3/uL (ref 4.0–10.5)
nRBC: 0 % (ref 0.0–0.2)

## 2022-01-27 LAB — URINALYSIS, ROUTINE W REFLEX MICROSCOPIC

## 2022-01-27 LAB — BASIC METABOLIC PANEL
Anion gap: 3 — ABNORMAL LOW (ref 5–15)
BUN: 12 mg/dL (ref 6–20)
CO2: 25 mmol/L (ref 22–32)
Calcium: 9.7 mg/dL (ref 8.9–10.3)
Chloride: 110 mmol/L (ref 98–111)
Creatinine, Ser: 0.74 mg/dL (ref 0.44–1.00)
GFR, Estimated: >60 mL/min (ref >60)
Glucose, Bld: 83 mg/dL (ref 70–99)
Potassium: 3.9 mmol/L (ref 3.5–5.1)
Sodium: 138 mmol/L (ref 135–145)

## 2022-01-27 LAB — URINALYSIS, MICROSCOPIC (REFLEX): RBC / HPF: >50 RBC/hpf (ref 0–5)

## 2022-01-27 LAB — HCG, QUANTITATIVE, PREGNANCY: hCG, Beta Chain, Quant, S: 98 m[IU]/mL — ABNORMAL HIGH (ref <5)

## 2022-01-27 LAB — PREGNANCY, URINE: Preg Test, Ur: POSITIVE — AB

## 2022-01-27 NOTE — ED Notes (Signed)
Pt states she had appointment at the health dept at 2 but came here instead , given referral for [facility]

## 2022-01-27 NOTE — Discharge Instructions (Signed)
You were seen in the emergency department for low abdominal pain and vaginal bleeding.  Your pregnancy test was positive and your quantitative hCG was 98.  This is very low for your dates.  You had a pelvic ultrasound that did not show any signs of intrauterine pregnancy.  Please contact your OB for close follow-up.  You can use Tylenol for pain.  Pelvic rest.  Return to the emergency department if any worsening or concerning symptoms.

## 2022-01-27 NOTE — ED Provider Notes (Signed)
[place] EMERGENCY DEPARTMENT Provider Note   CSN: [id number] Arrival date & time: 01/27/22  5277     History  Chief Complaint  Patient presents with   Vaginal Bleeding    Carol Tate is a 31 y.o. female.  She has no significant past medical history.  G4, P2 with the last menstrual period 8/13, positive home pregnancy test.  Complaining of low pelvic cramping pain that started around 2 AM associated with some vaginal bleeding.  She said there was some clots and now just some slow bleeding.  She is not seen her OB or had a confirmatory ultrasound.  Nausea no vomiting.  No fevers or chills.  No urinary symptoms.  The history is provided by the patient.  Vaginal Bleeding Quality:  Dark red and clots Severity:  Moderate Onset quality:  Sudden Timing:  Constant Progression:  Unchanged Chronicity:  New Possible pregnancy: yes   Context: spontaneously   Relieved by:  None tried Worsened by:  Nothing Ineffective treatments:  None tried Associated symptoms: abdominal pain and nausea   Associated symptoms: no dizziness, no dysuria and no fever   Risk factors: prior miscarriage        Home Medications Prior to Admission medications   Not on File      Allergies    Penicillins    Review of Systems   Review of Systems  Constitutional:  Negative for fever.  HENT:  Negative for sore throat.   Respiratory:  Negative for shortness of breath.   Cardiovascular:  Negative for chest pain.  Gastrointestinal:  Positive for abdominal pain and nausea.  Genitourinary:  Positive for vaginal bleeding. Negative for dysuria.  Skin:  Negative for rash.  Neurological:  Negative for dizziness.    Physical Exam Updated Vital Signs BP 136/80   Pulse (!) 105   Temp 98.8 F (37.1 C)   Resp 15   Ht 5\' 4"  (1.626 m)   Wt 72.6 kg   LMP 12/10/2021   SpO2 100%   BMI 27.46 kg/m  Physical Exam Vitals and nursing note reviewed.  Constitutional:      General: She is not in  acute distress.    Appearance: Normal appearance. She is well-developed.  HENT:     Head: Normocephalic and atraumatic.  Eyes:     Conjunctiva/sclera: Conjunctivae normal.  Cardiovascular:     Rate and Rhythm: Regular rhythm. Tachycardia present.     Heart sounds: No murmur heard. Pulmonary:     Effort: Pulmonary effort is normal. No respiratory distress.     Breath sounds: Normal breath sounds.  Abdominal:     Palpations: Abdomen is soft.     Tenderness: There is no abdominal tenderness. There is no guarding or rebound.  Musculoskeletal:     Cervical back: Neck supple.     Right lower leg: No edema.     Left lower leg: No edema.  Skin:    General: Skin is warm and dry.     Capillary Refill: Capillary refill takes less than 2 seconds.  Neurological:     General: No focal deficit present.     Mental Status: She is alert.     ED Results / Procedures / Treatments   Labs (all labs ordered are listed, but only abnormal results are displayed) Labs Reviewed  BASIC METABOLIC PANEL - Abnormal; Notable for the following components:      Result Value   Anion gap 3 (*)    All other components within  normal limits  CBC WITH DIFFERENTIAL/PLATELET - Abnormal; Notable for the following components:   Hemoglobin 11.3 (*)    HCT 35.3 (*)    RDW 17.1 (*)    All other components within normal limits  HCG, QUANTITATIVE, PREGNANCY - Abnormal; Notable for the following components:   hCG, Beta Chain, Quant, S 98 (*)    All other components within normal limits  URINALYSIS, ROUTINE W REFLEX MICROSCOPIC - Abnormal; Notable for the following components:   Color, Urine RED (*)    APPearance CLOUDY (*)    Glucose, UA   (*)    Value: TEST NOT REPORTED DUE TO COLOR INTERFERENCE OF URINE PIGMENT   Hgb urine dipstick   (*)    Value: TEST NOT REPORTED DUE TO COLOR INTERFERENCE OF URINE PIGMENT   Bilirubin Urine   (*)    Value: TEST NOT REPORTED DUE TO COLOR INTERFERENCE OF URINE PIGMENT   Ketones,  ur   (*)    Value: TEST NOT REPORTED DUE TO COLOR INTERFERENCE OF URINE PIGMENT   Protein, ur   (*)    Value: TEST NOT REPORTED DUE TO COLOR INTERFERENCE OF URINE PIGMENT   Nitrite   (*)    Value: TEST NOT REPORTED DUE TO COLOR INTERFERENCE OF URINE PIGMENT   Leukocytes,Ua   (*)    Value: TEST NOT REPORTED DUE TO COLOR INTERFERENCE OF URINE PIGMENT   All other components within normal limits  PREGNANCY, URINE - Abnormal; Notable for the following components:   Preg Test, Ur POSITIVE (*)    All other components within normal limits  URINALYSIS, MICROSCOPIC (REFLEX) - Abnormal; Notable for the following components:   Bacteria, UA FEW (*)    All other components within normal limits    EKG None  Radiology US OB LESS THAN 14 WEEKS WITH OB TRANSVAGINAL  Result Date: 01/27/2022 CLINICAL DATA:  Vaginal bleeding. EXAM: OBSTETRIC <14 WK [place] AND TRANSVAGINAL OB US TECHNIQUE: Both transabdominal and transvaginal ultrasound examinations were performed for complete evaluation of the gestation as well as the maternal uterus, adnexal regions, and pelvic cul-de-sac. Transvaginal technique was performed to assess early pregnancy. COMPARISON:  None Available. FINDINGS: Intrauterine gestational sac: None Maternal uterus/adnexae: The ovaries are normal in appearance. No free fluid in the pelvis. IMPRESSION: 1. No IUP is identified. The lack of an IUP in the setting of a positive pregnancy test may be seen with early pregnancy, recent miscarriage, or ectopic pregnancy. Recommend close follow-up. Electronically Signed   By: Gerome Sam III M.D.   On: 01/27/2022 11:06    Procedures Procedures    Medications Ordered in ED Medications - No data to display  ED Course/ Medical Decision Making/ A&P Clinical Course as of 01/27/22 1745  Mon Jan 27, 2022  1108 Reviewed prior records prior blood type was A+ [MB]  1113 Patient's work-up showing a very low quant at 98.  This is not consistent with her dates.   Pelvic ultrasound does not show any IUP.  Reviewed this with patient.  Either her dates are off and she still has an early active pregnancy or more likely she has miscarried.  She has an OB appointment today which I recommended that she continue.  Return instructions discussed [MB]    Clinical Course User Index [MB] Terrilee Files, MD                           Medical Decision Making Amount  and/or Complexity of Data Reviewed Labs: ordered. Radiology: ordered.   This patient complains of low abdominal pain and vaginal bleeding in early pregnancy; this involves an extensive number of treatment Options and is a complaint that carries with it a high risk of complications and morbidity. The differential includes miscarriage, threatened abortion, ectopic pregnancy, UTI  I ordered, reviewed and interpreted labs, which included CBC with normal white count hemoglobin slightly lower than priors, chemistries normal, beta quant low, urinalysis with hematuria I ordered imaging studies which included transvaginal ultrasound and I independently    visualized and interpreted imaging which showed no definite IUP Previous records obtained and reviewed in epic no recent admissions  Cardiac monitoring reviewed, normal sinus rhythm Social determinants considered, no significant barriers Critical Interventions: None  After the interventions stated above, I reevaluated the patient and found patient to be well-appearing and hemodynamically stable Admission and further testing considered, no indications for admission or further work-up at this time.  We will have patient follow-up with her OB and return instructions discussed.  Patient likely has a missed AB, less likely is that she is early for dates         Final Clinical Impression(s) / ED Diagnoses Final diagnoses:  Threatened miscarriage    Rx / DC Orders ED Discharge Orders     None         Terrilee Files, MD 01/27/22 1747

## 2022-01-27 NOTE — ED Triage Notes (Signed)
LMP 12/10/21. Pt reports she believes she is having a miscarriage. Noticed pink when she wiped approx 230 am and bleeding have progressively gotten worse and brighter

## 2022-01-27 NOTE — ED Notes (Signed)
Pt has prior ABO/Rh in 2016 & 2019. Informed EDP Melina Copa, verbal order received  to cancel order

## 2023-04-22 ENCOUNTER — Encounter (HOSPITAL_BASED_OUTPATIENT_CLINIC_OR_DEPARTMENT_OTHER): Payer: Self-pay

## 2023-04-22 ENCOUNTER — Emergency Department (HOSPITAL_BASED_OUTPATIENT_CLINIC_OR_DEPARTMENT_OTHER)
Admission: EM | Admit: 2023-04-22 | Discharge: 2023-04-22 | Disposition: A | Discharge status: Home / Self Care | Payer: Self-pay | Attending: Emergency Medicine | Admitting: Emergency Medicine

## 2023-04-22 DIAGNOSIS — R1084 Generalized abdominal pain: Secondary | ICD-10-CM | POA: Insufficient documentation

## 2023-04-22 DIAGNOSIS — R110 Nausea: Secondary | ICD-10-CM

## 2023-04-22 DIAGNOSIS — R112 Nausea with vomiting, unspecified: Secondary | ICD-10-CM | POA: Insufficient documentation

## 2023-04-22 LAB — COMPREHENSIVE METABOLIC PANEL
ALT: 14 U/L (ref 0–44)
AST: 21 U/L (ref 15–41)
Albumin: 3.8 g/dL (ref 3.5–5.0)
Alkaline Phosphatase: 35 U/L — ABNORMAL LOW (ref 38–126)
Anion gap: 8 (ref 5–15)
BUN: 11 mg/dL (ref 6–20)
CO2: 20 mmol/L — ABNORMAL LOW (ref 22–32)
Calcium: 9.6 mg/dL (ref 8.9–10.3)
Chloride: 107 mmol/L (ref 98–111)
Creatinine, Ser: 0.71 mg/dL (ref 0.44–1.00)
GFR, Estimated: >60 mL/min (ref >60)
Glucose, Bld: 130 mg/dL — ABNORMAL HIGH (ref 70–99)
Potassium: 4 mmol/L (ref 3.5–5.1)
Sodium: 135 mmol/L (ref 135–145)
Total Bilirubin: 0.6 mg/dL (ref <1.2)
Total Protein: 7.4 g/dL (ref 6.5–8.1)

## 2023-04-22 LAB — URINALYSIS, ROUTINE W REFLEX MICROSCOPIC
Bilirubin Urine: NEGATIVE
Glucose, UA: 100 mg/dL — AB
Hgb urine dipstick: NEGATIVE
Ketones, ur: NEGATIVE mg/dL
Leukocytes,Ua: NEGATIVE
Nitrite: NEGATIVE
Protein, ur: 30 mg/dL — AB
Specific Gravity, Urine: 1.025 (ref 1.005–1.030)
pH: 7 (ref 5.0–8.0)

## 2023-04-22 LAB — LIPASE, BLOOD: Lipase: 23 U/L (ref 11–51)

## 2023-04-22 LAB — CBC WITH DIFFERENTIAL/PLATELET
Abs Immature Granulocytes: 0.03 10*3/uL (ref 0.00–0.07)
Basophils Absolute: 0 10*3/uL (ref 0.0–0.1)
Basophils Relative: 0 %
Eosinophils Absolute: 0 10*3/uL (ref 0.0–0.5)
Eosinophils Relative: 0 %
HCT: 36.1 % (ref 36.0–46.0)
Hemoglobin: 11.5 g/dL — ABNORMAL LOW (ref 12.0–15.0)
Immature Granulocytes: 0 %
Lymphocytes Relative: 7 %
Lymphs Abs: 0.7 10*3/uL (ref 0.7–4.0)
MCH: 28.3 pg (ref 26.0–34.0)
MCHC: 31.9 g/dL (ref 30.0–36.0)
MCV: 88.9 fL (ref 80.0–100.0)
Monocytes Absolute: 0.1 10*3/uL (ref 0.1–1.0)
Monocytes Relative: 1 %
Neutro Abs: 9.2 10*3/uL — ABNORMAL HIGH (ref 1.7–7.7)
Neutrophils Relative %: 92 %
Platelets: 307 10*3/uL (ref 150–400)
RBC: 4.06 MIL/uL (ref 3.87–5.11)
RDW: 15.8 % — ABNORMAL HIGH (ref 11.5–15.5)
WBC: 10 10*3/uL (ref 4.0–10.5)
nRBC: 0 % (ref 0.0–0.2)

## 2023-04-22 LAB — URINALYSIS, MICROSCOPIC (REFLEX)

## 2023-04-22 LAB — PREGNANCY, URINE: Preg Test, Ur: NEGATIVE

## 2023-04-22 MED ORDER — ONDANSETRON HCL 4 MG/2ML IJ SOLN
4.0000 mg | Freq: Once | INTRAMUSCULAR | Status: AC
  Administered 2023-04-22: 4 mg via INTRAVENOUS
  Filled 2023-04-22: qty 2

## 2023-04-22 MED ORDER — FAMOTIDINE IN NACL 20-0.9 MG/50ML-% IV SOLN: 20.0000 mg | Freq: Once | INTRAVENOUS | Status: DC

## 2023-04-22 MED ORDER — ONDANSETRON HCL 4 MG PO TABS: 4.0000 mg | ORAL_TABLET | Freq: Four times a day (QID) | ORAL | 0 refills | Status: AC

## 2023-04-22 MED ORDER — PANTOPRAZOLE SODIUM 40 MG IV SOLR
40.0000 mg | Freq: Once | INTRAVENOUS | Status: AC
  Administered 2023-04-22: 40 mg via INTRAVENOUS
  Filled 2023-04-22: qty 10

## 2023-04-22 MED ORDER — SODIUM CHLORIDE 0.9 % IV BOLUS
1000.0000 mL | Freq: Once | INTRAVENOUS | Status: AC
  Administered 2023-04-22: 1000 mL via INTRAVENOUS

## 2023-04-22 MED ORDER — DICYCLOMINE HCL 10 MG/ML IM SOLN
20.0000 mg | Freq: Once | INTRAMUSCULAR | Status: AC
  Administered 2023-04-22: 20 mg via INTRAMUSCULAR
  Filled 2023-04-22: qty 2

## 2023-04-22 NOTE — Discharge Instructions (Signed)
Please follow-up with your primary care provider regards recent and ER visit.  Today your labs are reassuring and you are able to keep down fluids and medications.  As agreed I have prescribed nausea meds for the next few days.  Please remain hydrated and follow-up with your primary care provider the 1 I have attached here for you.  If symptoms change or worsen please return to the ER.

## 2023-04-22 NOTE — ED Triage Notes (Signed)
C/o vomiting x 12 hours, unable to tolerate PO. 1 episode diarrhea. Generalized abdominal pain.

## 2023-04-22 NOTE — ED Notes (Signed)
Labs redrawn per EDP request

## 2023-04-22 NOTE — ED Provider Notes (Signed)
[place] [facility] DEPARTMENT AT [facility] Provider Note   CSN: [id number] Arrival date & time: 04/22/23  0830     History  Chief Complaint  Patient presents with   Emesis    Carol Tate is a 32 y.o. female with no pertinent past medical history presented with nausea and vomiting for the past 12 hours.  Patient had hamburger steak last night but states that no one else is sick.  Patient denies fevers.  Patient states that she cannot keep food or fluids down.  Patient denies vaginal bleeding/dysuria, hematuria, lower abdominal pain, chest pain, shortness of breath.  Patient states she is here for medications and fluids.  Home Medications Prior to Admission medications   Medication Sig Start Date End Date Taking? Authorizing Provider  ondansetron (ZOFRAN) 4 MG tablet Take 1 tablet (4 mg total) by mouth every 6 (six) hours. 04/22/23  Yes Netta Corrigan, PA-C      Allergies    Penicillins    Review of Systems   Review of Systems  Gastrointestinal:  Positive for vomiting.    Physical Exam Updated Vital Signs BP 139/83   Pulse (!) 46   Temp 98.4 F (36.9 C) (Oral)   Resp (!) 21   Ht 5\' 4"  (1.626 m)   Wt 90.7 kg   SpO2 100%   BMI 34.33 kg/m  Physical Exam Vitals reviewed.  Constitutional:      General: She is not in acute distress.    Comments: Dry heaving  HENT:     Head: Normocephalic and atraumatic.  Eyes:     Extraocular Movements: Extraocular movements intact.     Conjunctiva/sclera: Conjunctivae normal.     Pupils: Pupils are equal, round, and reactive to light.  Cardiovascular:     Rate and Rhythm: Normal rate and regular rhythm.     Pulses: Normal pulses.     Heart sounds: Normal heart sounds.     Comments: 2+ bilateral radial/dorsalis pedis pulses with regular rate Pulmonary:     Effort: Pulmonary effort is normal. No respiratory distress.     Breath sounds: Normal breath sounds.  Abdominal:     Palpations: Abdomen is soft.      Tenderness: There is no abdominal tenderness. There is no guarding or rebound.  Musculoskeletal:        General: Normal range of motion.     Cervical back: Normal range of motion and neck supple.     Comments: 5 out of 5 bilateral grip/leg extension strength  Skin:    General: Skin is warm and dry.     Capillary Refill: Capillary refill takes less than 2 seconds.  Neurological:     General: No focal deficit present.     Mental Status: She is alert and oriented to person, place, and time.     Comments: Sensation intact in all 4 limbs  Psychiatric:        Mood and Affect: Mood normal.     ED Results / Procedures / Treatments   Labs (all labs ordered are listed, but only abnormal results are displayed) Labs Reviewed  CBC WITH DIFFERENTIAL/PLATELET - Abnormal; Notable for the following components:      Result Value   Hemoglobin 11.5 (*)    RDW 15.8 (*)    Neutro Abs 9.2 (*)    All other components within normal limits  URINALYSIS, ROUTINE W REFLEX MICROSCOPIC - Abnormal; Notable for the following components:   APPearance CLOUDY (*)  Glucose, UA 100 (*)    Protein, ur 30 (*)    All other components within normal limits  COMPREHENSIVE METABOLIC PANEL - Abnormal; Notable for the following components:   CO2 20 (*)    Glucose, Bld 130 (*)    Alkaline Phosphatase 35 (*)    All other components within normal limits  URINALYSIS, MICROSCOPIC (REFLEX) - Abnormal; Notable for the following components:   Bacteria, UA RARE (*)    All other components within normal limits  PREGNANCY, URINE  LIPASE, BLOOD    EKG None  Radiology No results found.  Procedures Procedures    Medications Ordered in ED Medications  ondansetron (ZOFRAN) injection 4 mg (4 mg Intravenous Given 04/22/23 0945)  sodium chloride 0.9 % bolus 1,000 mL (0 mLs Intravenous Stopped 04/22/23 1156)  dicyclomine (BENTYL) injection 20 mg (20 mg Intramuscular Given 04/22/23 1028)  pantoprazole (PROTONIX) injection  40 mg (40 mg Intravenous Given 04/22/23 1031)  ondansetron (ZOFRAN) injection 4 mg (4 mg Intravenous Given 04/22/23 1233)    ED Course/ Medical Decision Making/ A&P                                 Medical Decision Making Amount and/or Complexity of Data Reviewed Labs: ordered.  Risk Prescription drug management.   Carol Tate 32 y.o. presented today for abdominal pain.  Working DDx that I considered at this time includes, but not limited to, gastroenteritis, colitis, small bowel obstruction, appendicitis, cholecystitis, hepatobiliary pathology, gastritis, PUD, ACS, aortic dissection, diverticulosis/diverticulitis, pancreatitis, nephrolithiasis, medication induced, AAA, UTI, pyelonephritis, ruptured ectopic pregnancy, PID, ovarian torsion.  R/o DDx: gastroenteritis, colitis, small bowel obstruction, appendicitis, cholecystitis, hepatobiliary pathology, PUD, ACS, aortic dissection, diverticulosis/diverticulitis, pancreatitis, nephrolithiasis, medication induced, AAA, UTI, pyelonephritis, ruptured ectopic pregnancy, PID, ovarian torsion: These are considered less likely due to history of present illness, physical exam, labs/imaging findings.  Review of prior external notes: 01/27/22 ED  Unique Tests and My Interpretation:  CBC with differential: Unremarkable CMP: Unremarkable Lipase: Unremarkable UA: Unremarkable Urine Pregnancy: negative EKG: Rate, rhythm, axis, intervals all examined and without medically relevant abnormality. ST segments without concerns for elevations  Social Determinants of Health: none  Discussion with Independent Historian:  Mother  Discussion of Management of Tests: None  Risk: Medium: prescription drug management  Risk Stratification Score: None  Plan: On exam patient was in no acute distress with stable vitals.  Patient was dry heaving on exam but otherwise did not have any abdominal tenderness peritoneal signs and had reassuring physical exam.  Will  give fluids and Zofran obtain labs.  Since patient's heart rate is in the low 50s will avoid narcotics at this time give Protonix and Bentyl.  Patient's labs came back reassuring.  On recheck abdomen still nontender without peritoneal signs.  Patient was given other round of Zofran and p.o. challenge with apple juice in which she was able to keep the apple juice down.  Patient I had shared decision making agreed that she does not need an ultrasound or CT scan at this time due to her abdomen not being tender and her labs being reassuring.  With asthma and reassuring lung physical exam do feel that patient could have viral gastritis.  Patient's heart rate was slightly bradycardic in the room at 50 however patient was resting comfortably and endorsing any chest pain or shortness of breath that would necessitate further workup at this time.  Patient also does not have  cardiac history.  Will discharge on Zofran and have her follow-up with her primary care provider.  Encouraged her to remain hydrated food as tolerated.  Patient was given return precautions. Patient stable for discharge at this time.  Patient verbalized understanding of plan.  This chart was dictated using voice recognition software.  Despite best efforts to proofread,  errors can occur which can change the documentation meaning.         Final Clinical Impression(s) / ED Diagnoses Final diagnoses:  Nausea    Rx / DC Orders ED Discharge Orders          Ordered    ondansetron (ZOFRAN) 4 MG tablet  Every 6 hours        04/22/23 1342              Netta Corrigan, PA-C 04/22/23 1345    Ernie Avena, MD 04/22/23 1500

## 2023-04-26 ENCOUNTER — Emergency Department (HOSPITAL_BASED_OUTPATIENT_CLINIC_OR_DEPARTMENT_OTHER)
Admission: EM | Admit: 2023-04-26 | Discharge: 2023-04-27 | Discharge status: Against Medical Advice | Payer: Self-pay | Attending: Emergency Medicine | Admitting: Emergency Medicine

## 2023-04-26 ENCOUNTER — Emergency Department (HOSPITAL_BASED_OUTPATIENT_CLINIC_OR_DEPARTMENT_OTHER): Payer: Self-pay

## 2023-04-26 ENCOUNTER — Encounter (HOSPITAL_BASED_OUTPATIENT_CLINIC_OR_DEPARTMENT_OTHER): Payer: Self-pay | Admitting: Emergency Medicine

## 2023-04-26 DIAGNOSIS — M546 Pain in thoracic spine: Secondary | ICD-10-CM | POA: Insufficient documentation

## 2023-04-26 DIAGNOSIS — Z1152 Encounter for screening for COVID-19: Secondary | ICD-10-CM | POA: Insufficient documentation

## 2023-04-26 DIAGNOSIS — R7989 Other specified abnormal findings of blood chemistry: Secondary | ICD-10-CM

## 2023-04-26 DIAGNOSIS — E876 Hypokalemia: Secondary | ICD-10-CM | POA: Diagnosis present

## 2023-04-26 LAB — CBC WITH DIFFERENTIAL/PLATELET
Abs Immature Granulocytes: 0.04 10*3/uL (ref 0.00–0.07)
Basophils Absolute: 0 10*3/uL (ref 0.0–0.1)
Basophils Relative: 1 %
Eosinophils Absolute: 0.1 10*3/uL (ref 0.0–0.5)
Eosinophils Relative: 1 %
HCT: 40.2 % (ref 36.0–46.0)
Hemoglobin: 13.4 g/dL (ref 12.0–15.0)
Immature Granulocytes: 1 %
Lymphocytes Relative: 19 %
Lymphs Abs: 1.7 10*3/uL (ref 0.7–4.0)
MCH: 28.2 pg (ref 26.0–34.0)
MCHC: 33.3 g/dL (ref 30.0–36.0)
MCV: 84.5 fL (ref 80.0–100.0)
Monocytes Absolute: 0.6 10*3/uL (ref 0.1–1.0)
Monocytes Relative: 7 %
Neutro Abs: 6.2 10*3/uL (ref 1.7–7.7)
Neutrophils Relative %: 71 %
Platelets: 355 10*3/uL (ref 150–400)
RBC: 4.76 MIL/uL (ref 3.87–5.11)
RDW: 14.4 % (ref 11.5–15.5)
WBC: 8.6 10*3/uL (ref 4.0–10.5)
nRBC: 0 % (ref 0.0–0.2)

## 2023-04-26 LAB — COMPREHENSIVE METABOLIC PANEL
ALT: 13 U/L (ref 0–44)
AST: 14 U/L — ABNORMAL LOW (ref 15–41)
Albumin: 3.7 g/dL (ref 3.5–5.0)
Alkaline Phosphatase: 41 U/L (ref 38–126)
Anion gap: 8 (ref 5–15)
BUN: 12 mg/dL (ref 6–20)
CO2: 24 mmol/L (ref 22–32)
Calcium: 9.9 mg/dL (ref 8.9–10.3)
Chloride: 99 mmol/L (ref 98–111)
Creatinine, Ser: 0.75 mg/dL (ref 0.44–1.00)
GFR, Estimated: >60 mL/min (ref >60)
Glucose, Bld: 143 mg/dL — ABNORMAL HIGH (ref 70–99)
Potassium: 2.6 mmol/L — CL (ref 3.5–5.1)
Sodium: 131 mmol/L — ABNORMAL LOW (ref 135–145)
Total Bilirubin: 0.7 mg/dL (ref <1.2)
Total Protein: 7.4 g/dL (ref 6.5–8.1)

## 2023-04-26 LAB — URINALYSIS, ROUTINE W REFLEX MICROSCOPIC
Glucose, UA: NEGATIVE mg/dL
Ketones, ur: 15 mg/dL — AB
Leukocytes,Ua: NEGATIVE
Nitrite: NEGATIVE
Protein, ur: NEGATIVE mg/dL
Specific Gravity, Urine: 1.015 (ref 1.005–1.030)
pH: 7 (ref 5.0–8.0)

## 2023-04-26 LAB — URINALYSIS, MICROSCOPIC (REFLEX)

## 2023-04-26 LAB — TROPONIN I (HIGH SENSITIVITY): Troponin I (High Sensitivity): 60 ng/L — ABNORMAL HIGH (ref <18)

## 2023-04-26 LAB — RESP PANEL BY RT-PCR (RSV, FLU A&B, COVID)  RVPGX2
Influenza A by PCR: NEGATIVE
Influenza B by PCR: NEGATIVE
Resp Syncytial Virus by PCR: NEGATIVE
SARS Coronavirus 2 by RT PCR: NEGATIVE

## 2023-04-26 LAB — MAGNESIUM: Magnesium: 2.2 mg/dL (ref 1.7–2.4)

## 2023-04-26 LAB — LIPASE, BLOOD: Lipase: 100 U/L — ABNORMAL HIGH (ref 11–51)

## 2023-04-26 LAB — PREGNANCY, URINE: Preg Test, Ur: NEGATIVE

## 2023-04-26 MED ORDER — HYDROMORPHONE HCL 1 MG/ML IJ SOLN
1.0000 mg | Freq: Once | INTRAMUSCULAR | Status: AC
  Administered 2023-04-26: 1 mg via INTRAVENOUS
  Filled 2023-04-26: qty 1

## 2023-04-26 MED ORDER — POTASSIUM CHLORIDE 10 MEQ/100ML IV SOLN
10.0000 meq | INTRAVENOUS | Status: AC
  Administered 2023-04-27 (×4): 10 meq via INTRAVENOUS
  Filled 2023-04-26 (×4): qty 100

## 2023-04-26 MED ORDER — POTASSIUM CHLORIDE CRYS ER 20 MEQ PO TBCR
40.0000 meq | EXTENDED_RELEASE_TABLET | Freq: Once | ORAL | Status: AC
  Administered 2023-04-26: 40 meq via ORAL
  Filled 2023-04-26: qty 2

## 2023-04-26 MED ORDER — METOCLOPRAMIDE HCL 5 MG/ML IJ SOLN
10.0000 mg | Freq: Once | INTRAMUSCULAR | Status: AC
Start: 2023-04-26 — End: 2023-04-26
  Administered 2023-04-26: 10 mg via INTRAVENOUS
  Filled 2023-04-26: qty 2

## 2023-04-26 MED ORDER — SODIUM CHLORIDE 0.9 % IV SOLN
INTRAVENOUS | Status: DC | PRN
Start: 2023-04-26 — End: 2023-04-27
  Administered 2023-04-27: 500 mL via INTRAVENOUS

## 2023-04-26 MED ORDER — IOHEXOL 300 MG/ML  SOLN
100.0000 mL | Freq: Once | INTRAMUSCULAR | Status: AC | PRN
  Administered 2023-04-26: 100 mL via INTRAVENOUS

## 2023-04-26 MED ORDER — POTASSIUM CHLORIDE IN NACL 20-0.9 MEQ/L-% IV SOLN
INTRAVENOUS | Status: DC
  Filled 2023-04-26: qty 1000

## 2023-04-26 NOTE — ED Triage Notes (Signed)
Pt w/ continued NV, abd pain; reports this has been going on since 12/24; was seen once and LWBS once prior to today

## 2023-04-26 NOTE — ED Provider Notes (Signed)
[facility] Provider Note   CSN: [id number] Arrival date & time: 04/26/23  1904     History  Chief Complaint  Patient presents with   Emesis   Abdominal Pain    Clovis Brozyna is a 32 y.o. female.  HPI 32 year old female presents with vomiting and abdominal pain for 5 days.  She has been having thoracic back pain as well as some chest pain for 5 days as well.  No significant cough.  No fevers or diarrhea.  No urinary symptoms.  Started having some vaginal bleeding today.  The pain in her left abdomen has been gradually worsening for the past 5 days.  Home Medications Prior to Admission medications   Medication Sig Start Date End Date Taking? Authorizing Provider  ondansetron (ZOFRAN) 4 MG tablet Take 1 tablet (4 mg total) by mouth every 6 (six) hours. 04/22/23   Netta Corrigan, PA-C      Allergies    Penicillins    Review of Systems   Review of Systems  Constitutional:  Negative for fever.  Respiratory:  Negative for shortness of breath.   Cardiovascular:  Positive for chest pain.  Gastrointestinal:  Positive for abdominal pain, nausea and vomiting. Negative for diarrhea.  Genitourinary:  Positive for vaginal bleeding. Negative for dysuria.  Musculoskeletal:  Positive for back pain.    Physical Exam Updated Vital Signs BP (!) 150/92 (BP Location: Right Arm)   Pulse (!) 56   Temp 98.3 F (36.8 C) (Oral)   Resp (!) 22   Ht 5\' 4"  (1.626 m)   Wt 90.7 kg   LMP 04/26/2023   SpO2 100%   BMI 34.32 kg/m  Physical Exam Vitals and nursing note reviewed.  Constitutional:      General: She is not in acute distress.    Appearance: She is well-developed. She is not ill-appearing or diaphoretic.  HENT:     Head: Normocephalic and atraumatic.  Cardiovascular:     Rate and Rhythm: Normal rate and regular rhythm.     Heart sounds: Normal heart sounds.  Pulmonary:     Effort: Pulmonary effort is normal.     Breath sounds:  Normal breath sounds.  Abdominal:     Palpations: Abdomen is soft.     Tenderness: There is abdominal tenderness in the left upper quadrant and left lower quadrant.  Skin:    General: Skin is warm and dry.  Neurological:     Mental Status: She is alert.     ED Results / Procedures / Treatments   Labs (all labs ordered are listed, but only abnormal results are displayed) Labs Reviewed  LIPASE, BLOOD - Abnormal; Notable for the following components:      Result Value   Lipase 100 (*)    All other components within normal limits  COMPREHENSIVE METABOLIC PANEL - Abnormal; Notable for the following components:   Sodium 131 (*)    Potassium 2.6 (*)    Glucose, Bld 143 (*)    AST 14 (*)    All other components within normal limits  URINALYSIS, ROUTINE W REFLEX MICROSCOPIC - Abnormal; Notable for the following components:   APPearance HAZY (*)    Hgb urine dipstick MODERATE (*)    Bilirubin Urine SMALL (*)    Ketones, ur 15 (*)    All other components within normal limits  URINALYSIS, MICROSCOPIC (REFLEX) - Abnormal; Notable for the following components:   Bacteria, UA RARE (*)  All other components within normal limits  TROPONIN I (HIGH SENSITIVITY) - Abnormal; Notable for the following components:   Troponin I (High Sensitivity) 60 (*)    All other components within normal limits  RESP PANEL BY RT-PCR (RSV, FLU A&B, COVID)  RVPGX2  PREGNANCY, URINE  CBC WITH DIFFERENTIAL/PLATELET  MAGNESIUM    EKG EKG Interpretation Date/Time:  Sunday April 26 2023 22:17:10 EST Ventricular Rate:  55 PR Interval:  118 QRS Duration:  92 QT Interval:  424 QTC Calculation: 406 R Axis:   9  Text Interpretation: Sinus rhythm Borderline short PR interval LVH by voltage nonspecific changes Confirmed by Pricilla Loveless [id number]) on 04/26/2023 10:45:51 PM  Radiology CT ABDOMEN PELVIS W CONTRAST Result Date: 04/26/2023 CLINICAL DATA:  Left-sided abdominal pain for several days, initial  encounter EXAM: CT ABDOMEN AND PELVIS WITH CONTRAST TECHNIQUE: Multidetector CT imaging of the abdomen and pelvis was performed using the standard protocol following bolus administration of intravenous contrast. RADIATION DOSE REDUCTION: This exam was performed according to the departmental dose-optimization program which includes automated exposure control, adjustment of the mA and/or kV according to patient size and/or use of iterative reconstruction technique. CONTRAST:  OMNIPAQUE IOHEXOL 300 MG/ML  SOLN COMPARISON:  None Available. FINDINGS: Lower chest: No acute abnormality. Hepatobiliary: No focal liver abnormality is seen. No gallstones, gallbladder wall thickening, or biliary dilatation. Pancreas: Unremarkable. No pancreatic ductal dilatation or surrounding inflammatory changes. Spleen: Normal in size without focal abnormality. Adrenals/Urinary Tract: Adrenal glands are within normal limits. Kidneys demonstrate a normal enhancement pattern bilaterally. No renal calculi or obstructive changes are seen. The bladder is decompressed. Stomach/Bowel: No obstructive or inflammatory changes of the colon are noted. The appendix is within normal limits. Small bowel and stomach are unremarkable. Vascular/Lymphatic: No significant vascular findings are present. No enlarged abdominal or pelvic lymph nodes. Reproductive: Uterus and bilateral adnexa are unremarkable. Other: No abdominal wall hernia or abnormality. No abdominopelvic ascites. Musculoskeletal: No acute or significant osseous findings. IMPRESSION: No acute abnormality noted. Electronically Signed   By: Alcide Clever M.D.   On: 04/26/2023 23:18   DG Chest Portable 1 View Result Date: 04/26/2023 CLINICAL DATA:  Nausea and vomiting, abdominal pain for 5 days, tobacco abuse EXAM: PORTABLE CHEST 1 VIEW COMPARISON:  06/19/2020 FINDINGS: The heart size and mediastinal contours are within normal limits. Both lungs are clear. The visualized skeletal structures  are unremarkable. IMPRESSION: No active disease. Electronically Signed   By: Sharlet Salina M.D.   On: 04/26/2023 22:55    Procedures .Critical Care  Performed by: Pricilla Loveless, MD Authorized by: Pricilla Loveless, MD   Critical care provider statement:    Critical care time (minutes):  35   Critical care time was exclusive of:  Separately billable procedures and treating other patients   Critical care was necessary to treat or prevent imminent or life-threatening deterioration of the following conditions:  Metabolic crisis and cardiac failure   Critical care was time spent personally by me on the following activities:  Development of treatment plan with patient or surrogate, discussions with consultants, evaluation of patient's response to treatment, examination of patient, ordering and review of laboratory studies, ordering and review of radiographic studies, ordering and performing treatments and interventions, pulse oximetry, re-evaluation of patient's condition and review of old charts     Medications Ordered in ED Medications  potassium chloride 10 mEq in 100 mL IVPB (has no administration in time range)  HYDROmorphone (DILAUDID) injection 1 mg (1 mg Intravenous Given 04/26/23  2232)  metoCLOPramide (REGLAN) injection 10 mg (10 mg Intravenous Given 04/26/23 2231)  potassium chloride SA (KLOR-CON M) CR tablet 40 mEq (40 mEq Oral Given 04/26/23 2237)  iohexol (OMNIPAQUE) 300 MG/ML solution 100 mL (100 mLs Intravenous Contrast Given 04/26/23 2314)    ED Course/ Medical Decision Making/ A&P                                 Medical Decision Making Amount and/or Complexity of Data Reviewed Labs: ordered.    Details: Hypokalemia to 2.6. Radiology: ordered and independent interpretation performed.    Details: No pneumonia or acute obstruction/appendicitis. ECG/medicine tests: ordered and independent interpretation performed.    Details: No acute ischemia, does have nonspecific  changes.  Risk Prescription drug management. Decision regarding hospitalization.   Patient presents with vomiting and abdominal pain.  She does have hypokalemia which is almost entirely from the vomiting itself.  Was able to tolerate some oral potassium after nausea meds but with her chest pain a troponin have been sent and is now mildly elevated at 60.  Think ACS is unlikely, especially since her symptoms are better with some treatment.  However with vomiting and abdominal pain, could consider that she is having a viral illness and perhaps some myocarditis.  With no prior history of this or significant cardiac risk factors I think it be reasonable to get an echo in the setting of possible myocarditis.  Will give oral and IV potassium. Discussed with Dr. Joneen Roach for admission.        Final Clinical Impression(s) / ED Diagnoses Final diagnoses:  Elevated troponin  Hypokalemia    Rx / DC Orders ED Discharge Orders     None         Pricilla Loveless, MD 04/26/23 2352

## 2023-04-27 ENCOUNTER — Other Ambulatory Visit: Payer: Self-pay

## 2023-04-27 ENCOUNTER — Other Ambulatory Visit (HOSPITAL_BASED_OUTPATIENT_CLINIC_OR_DEPARTMENT_OTHER): Payer: Self-pay

## 2023-04-27 LAB — BASIC METABOLIC PANEL
Anion gap: 6 (ref 5–15)
BUN: 9 mg/dL (ref 6–20)
CO2: 24 mmol/L (ref 22–32)
Calcium: 9.2 mg/dL (ref 8.9–10.3)
Chloride: 104 mmol/L (ref 98–111)
Creatinine, Ser: 0.68 mg/dL (ref 0.44–1.00)
GFR, Estimated: >60 mL/min (ref >60)
Glucose, Bld: 83 mg/dL (ref 70–99)
Potassium: 4.4 mmol/L (ref 3.5–5.1)
Sodium: 134 mmol/L — ABNORMAL LOW (ref 135–145)

## 2023-04-27 LAB — TROPONIN I (HIGH SENSITIVITY): Troponin I (High Sensitivity): 47 ng/L — ABNORMAL HIGH (ref <18)

## 2023-04-27 MED ORDER — OMEPRAZOLE 20 MG PO CPDR
20.0000 mg | DELAYED_RELEASE_CAPSULE | Freq: Every day | ORAL | 0 refills | Status: AC
  Filled 2023-04-27: qty 14, 14d supply, fill #0

## 2023-04-27 MED ORDER — ONDANSETRON 4 MG PO TBDP
4.0000 mg | ORAL_TABLET | Freq: Three times a day (TID) | ORAL | 0 refills | Status: AC | PRN
  Filled 2023-04-27: qty 8, 3d supply, fill #0

## 2023-04-27 MED ORDER — HYDROMORPHONE HCL 1 MG/ML IJ SOLN
0.5000 mg | Freq: Once | INTRAMUSCULAR | Status: AC
  Administered 2023-04-27: 0.5 mg via INTRAVENOUS
  Filled 2023-04-27: qty 1

## 2023-04-27 NOTE — ED Provider Notes (Signed)
  Physical Exam  BP (!) 122/96 (BP Location: Right Arm)   Pulse 61   Temp 98 F (36.7 C) (Oral)   Resp 14   Ht 5\' 4"  (1.626 m)   Wt 90.7 kg   LMP 04/26/2023   SpO2 100%   BMI 34.32 kg/m   Physical Exam  Procedures  Procedures  ED Course / MDM    Medical Decision Making Amount and/or Complexity of Data Reviewed Labs: ordered. Radiology: ordered.  Risk Prescription drug management. Decision regarding hospitalization.   Patient pending admission to the hospital.  Had admitting order placed almost 13 hours ago.  Still pending bed.  Patient somewhat frustrated that she is still here.  Discussed with patient about myocarditis that we are trying to rule out.  However reviewed blood work and I think is reasonable to tolerate orals at this time.  Patient willing to stay for now.       Benjiman Core, MD 04/27/23 [phone]

## 2023-04-27 NOTE — Discharge Instructions (Addendum)
You are leaving against our advice.  We have not completed our workup and this could cause harm to you.  Follow-up with cardiology.  They should be contacting you.

## 2023-04-27 NOTE — ED Notes (Signed)
Patient requested to leave AMA, stating she does not want to wait any longer for an inpatient bed. Patient understood risks of leaving. Provider made aware and spoke with patient.

## 2023-05-08 ENCOUNTER — Other Ambulatory Visit (HOSPITAL_BASED_OUTPATIENT_CLINIC_OR_DEPARTMENT_OTHER): Payer: Self-pay

## 2023-05-13 ENCOUNTER — Ambulatory Visit: Payer: Self-pay | Attending: Cardiovascular Disease | Admitting: Cardiovascular Disease

## 2023-05-14 ENCOUNTER — Encounter: Payer: Self-pay | Admitting: Cardiovascular Disease
